# Patient Record
Sex: Female | Born: 1990 | Race: Black or African American | Hispanic: No | Marital: Single | Smoking: Never smoker
Health system: Southern US, Community
[De-identification: ages and names within clinical notes are randomized; demographics above are authoritative.]

## PROBLEM LIST (undated history)

## (undated) ENCOUNTER — Inpatient Hospital Stay (HOSPITAL_COMMUNITY): Payer: Self-pay

## (undated) DIAGNOSIS — Z789 Other specified health status: Secondary | ICD-10-CM

---
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
## 2010-06-30 HISTORY — PX: INDUCED ABORTION: SHX677

## 2010-10-13 ENCOUNTER — Inpatient Hospital Stay (HOSPITAL_COMMUNITY)
Admission: AD | Admit: 2010-10-13 | Discharge: 2010-10-13 | Disposition: A | Discharge status: Home / Self Care | Payer: Medicaid Other | Source: Ambulatory Visit | Attending: Family Medicine | Admitting: Family Medicine

## 2010-10-13 DIAGNOSIS — R109 Unspecified abdominal pain: Secondary | ICD-10-CM

## 2010-10-13 LAB — URINALYSIS, ROUTINE W REFLEX MICROSCOPIC
Bilirubin Urine: NEGATIVE
Nitrite: NEGATIVE
Protein, ur: NEGATIVE mg/dL
Specific Gravity, Urine: 1.025 (ref 1.005–1.030)
Urobilinogen, UA: 0.2 mg/dL (ref 0.0–1.0)

## 2010-10-13 LAB — URINE MICROSCOPIC-ADD ON

## 2010-10-13 LAB — CBC
Platelets: 280 10*3/uL (ref 150–400)
RBC: 4.52 MIL/uL (ref 3.87–5.11)
RDW: 12.7 % (ref 11.5–15.5)
WBC: 8.4 10*3/uL (ref 4.0–10.5)

## 2010-10-13 LAB — POCT PREGNANCY, URINE: Preg Test, Ur: NEGATIVE

## 2010-10-13 LAB — WET PREP, GENITAL
Trich, Wet Prep: NONE SEEN
Yeast Wet Prep HPF POC: NONE SEEN

## 2010-10-14 LAB — GC/CHLAMYDIA PROBE AMP, GENITAL
Chlamydia, DNA Probe: NEGATIVE
GC Probe Amp, Genital: NEGATIVE

## 2011-04-15 ENCOUNTER — Emergency Department (HOSPITAL_COMMUNITY)
Admission: EM | Admit: 2011-04-15 | Discharge: 2011-04-15 | Disposition: A | Discharge status: Home / Self Care | Payer: Self-pay | Attending: Emergency Medicine | Admitting: Emergency Medicine

## 2011-04-15 DIAGNOSIS — R112 Nausea with vomiting, unspecified: Secondary | ICD-10-CM | POA: Insufficient documentation

## 2011-04-15 DIAGNOSIS — R197 Diarrhea, unspecified: Secondary | ICD-10-CM | POA: Insufficient documentation

## 2011-04-15 DIAGNOSIS — B3731 Acute candidiasis of vulva and vagina: Secondary | ICD-10-CM | POA: Insufficient documentation

## 2011-04-15 DIAGNOSIS — B373 Candidiasis of vulva and vagina: Secondary | ICD-10-CM | POA: Insufficient documentation

## 2011-04-15 LAB — POCT PREGNANCY, URINE: Preg Test, Ur: NEGATIVE

## 2011-04-15 LAB — URINALYSIS, ROUTINE W REFLEX MICROSCOPIC
Bilirubin Urine: NEGATIVE
Ketones, ur: NEGATIVE mg/dL
Nitrite: NEGATIVE
Protein, ur: NEGATIVE mg/dL
Urobilinogen, UA: 0.2 mg/dL (ref 0.0–1.0)

## 2011-04-15 LAB — WET PREP, GENITAL
Clue Cells Wet Prep HPF POC: NONE SEEN
Trich, Wet Prep: NONE SEEN
Yeast Wet Prep HPF POC: NONE SEEN

## 2011-04-15 LAB — URINE MICROSCOPIC-ADD ON

## 2011-04-17 LAB — GC/CHLAMYDIA PROBE AMP, GENITAL: GC Probe Amp, Genital: NEGATIVE

## 2013-08-26 ENCOUNTER — Inpatient Hospital Stay (HOSPITAL_COMMUNITY)
Admission: AD | Admit: 2013-08-26 | Discharge: 2013-08-26 | Disposition: A | Discharge status: Home / Self Care | Payer: BC Managed Care – PPO | Source: Ambulatory Visit | Attending: Obstetrics & Gynecology | Admitting: Obstetrics & Gynecology

## 2013-08-26 ENCOUNTER — Encounter (HOSPITAL_COMMUNITY): Payer: Self-pay | Admitting: *Deleted

## 2013-08-26 DIAGNOSIS — I770 Arteriovenous fistula, acquired: Secondary | ICD-10-CM | POA: Insufficient documentation

## 2013-08-26 DIAGNOSIS — N938 Other specified abnormal uterine and vaginal bleeding: Secondary | ICD-10-CM | POA: Insufficient documentation

## 2013-08-26 DIAGNOSIS — N926 Irregular menstruation, unspecified: Secondary | ICD-10-CM | POA: Insufficient documentation

## 2013-08-26 DIAGNOSIS — N939 Abnormal uterine and vaginal bleeding, unspecified: Secondary | ICD-10-CM

## 2013-08-26 DIAGNOSIS — N949 Unspecified condition associated with female genital organs and menstrual cycle: Secondary | ICD-10-CM | POA: Insufficient documentation

## 2013-08-26 HISTORY — DX: Other specified health status: Z78.9

## 2013-08-26 LAB — CBC
HEMATOCRIT: 30.8 % — AB (ref 36.0–46.0)
Hemoglobin: 10.5 g/dL — ABNORMAL LOW (ref 12.0–15.0)
MCH: 29.2 pg (ref 26.0–34.0)
MCHC: 34.1 g/dL (ref 30.0–36.0)
MCV: 85.6 fL (ref 78.0–100.0)
Platelets: 281 10*3/uL (ref 150–400)
RBC: 3.6 MIL/uL — ABNORMAL LOW (ref 3.87–5.11)
RDW: 13.4 % (ref 11.5–15.5)
WBC: 6.1 10*3/uL (ref 4.0–10.5)

## 2013-08-26 LAB — URINALYSIS, ROUTINE W REFLEX MICROSCOPIC
Bilirubin Urine: NEGATIVE
GLUCOSE, UA: NEGATIVE mg/dL
Ketones, ur: 15 mg/dL — AB
LEUKOCYTES UA: NEGATIVE
NITRITE: NEGATIVE
PH: 5.5 (ref 5.0–8.0)
PROTEIN: NEGATIVE mg/dL
Specific Gravity, Urine: 1.015 (ref 1.005–1.030)
Urobilinogen, UA: 0.2 mg/dL (ref 0.0–1.0)

## 2013-08-26 LAB — URINE MICROSCOPIC-ADD ON

## 2013-08-26 LAB — POCT PREGNANCY, URINE: PREG TEST UR: NEGATIVE

## 2013-08-26 NOTE — MAU Note (Signed)
Has been having irregular periods since Nov.  LMP was last wk, stopped for a few days and started again yesterday.

## 2013-08-26 NOTE — MAU Note (Signed)
Went to get an US of the uterus today, for heavy bleeding. (primary care physician sent her here to see GYN, she brought the disc from the US with her)

## 2013-08-26 NOTE — MAU Provider Note (Signed)
History     CSN: [id number]  Arrival date and time: 08/26/13 1541   First Provider Initiated Contact with Patient 08/26/13 1734      Chief Complaint  Patient presents with  . referral for GYN    HPI  [name] is a 23 y.o. female G1P0010 who was sent from [facility] following an [place] that showed an AV fistula. On 1/17: she started her menstrual cycle and it was much heavier than normal.  Her menstrual cycle stopped and then restarted again this past Monday. Currently her bleeding is light. She had an US done at [facility]; report was faxed and shows highly vascular structure filling the endometrium with prominent myometrial vessels. 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 Pt needs GYN referral and MRI outpatient.  Both ovaries show normal vascular flow.  Small cyst versus small paraovarian cyst on the right.    OB History   Grav Para Term Preterm Abortions TAB SAB Ect Mult Living   1 0 0 0 1 0 0 0 0 0       Past Medical History  Diagnosis Date  . Medical history non-contributory     Past Surgical History  Procedure Laterality Date  . Induced abortion  06/2010    Family History  Problem Relation Age of Onset  . Hypertension Mother   . Hypertension Maternal Grandmother     History  Substance Use Topics  . Smoking status: Never Smoker   . Smokeless tobacco: Not on file  . Alcohol Use: No    Allergies: No Known Allergies  Prescriptions prior to admission  Medication Sig Dispense Refill  . norethindrone-ethinyl estradiol 1/35 (ORTHO-NOVUM, NORTREL,CYCLAFEM) tablet Take 1 tablet by mouth daily.       Results for orders placed during the hospital encounter of 08/26/13 (from the past 48 hour(s))  URINALYSIS, ROUTINE W REFLEX MICROSCOPIC     Status: Abnormal   Collection Time    08/26/13  4:45 PM      Result Value Range   Color, Urine YELLOW  YELLOW   APPearance CLEAR  CLEAR   Specific Gravity, Urine 1.015  1.005 - 1.030   pH 5.5  5.0  - 8.0   Glucose, UA NEGATIVE  NEGATIVE mg/dL   Hgb urine dipstick LARGE (*) NEGATIVE   Bilirubin Urine NEGATIVE  NEGATIVE   Ketones, ur 15 (*) NEGATIVE mg/dL   Protein, ur NEGATIVE  NEGATIVE mg/dL   Urobilinogen, UA 0.2  0.0 - 1.0 mg/dL   Nitrite NEGATIVE  NEGATIVE   Leukocytes, UA NEGATIVE  NEGATIVE  URINE MICROSCOPIC-ADD ON     Status: Abnormal   Collection Time    08/26/13  4:45 PM      Result Value Range   Squamous Epithelial / LPF FEW (*) RARE   RBC / HPF 0-2  <3 RBC/hpf  CBC     Status: Abnormal   Collection Time    08/26/13  5:06 PM      Result Value Range   WBC 6.1  4.0 - 10.5 K/uL   RBC 3.60 (*) 3.87 - 5.11 MIL/uL   Hemoglobin 10.5 (*) 12.0 - 15.0 g/dL   HCT 40.9 (*) 81.1 - 91.4 %   MCV 85.6  78.0 - 100.0 fL   MCH 29.2  26.0 - 34.0 pg   MCHC 34.1  30.0 - 36.0 g/dL   RDW 78.2  95.6 - 21.3 %   Platelets 281  150 - 400  K/uL  POCT PREGNANCY, URINE     Status: None   Collection Time    08/26/13  5:10 PM      Result Value Range   Preg Test, Ur NEGATIVE  NEGATIVE   Comment:            THE SENSITIVITY OF THIS     METHODOLOGY IS >24 mIU/mL      Review of Systems  Constitutional: Negative for fever and chills.  Gastrointestinal: Negative for nausea, vomiting, abdominal pain, diarrhea and constipation.  Genitourinary: Negative for dysuria, urgency, frequency, hematuria and flank pain.       No vaginal discharge. + vaginal bleeding; light  No dysuria.    Physical Exam   Blood pressure 115/77, pulse 51, temperature 99.2 F (37.3 C), temperature source Oral, resp. rate 16, height 4\' 11"  (1.499 m), weight 63.504 kg (140 lb), last menstrual period 08/16/2013.  Physical Exam  Constitutional: She is oriented to person, place, and time. She appears well-developed and well-nourished. No distress.  HENT:  Head: Normocephalic.  Eyes: Pupils are equal, round, and reactive to light.  Neck: Neck supple.  Respiratory: Effort normal.  GI: Soft. She exhibits no distension  and no mass. There is no tenderness. There is no rebound and no guarding.  Genitourinary:  Speculum exam: Vagina - Small amount of red, mucus like discharge. No pooling of blood in the vagina  Cervix - No contact bleeding, small active bleeding from OS  Bimanual exam: Cervix closed Uterus non tender, normal size Adnexa non tender, no masses bilaterally Chaperone present for exam.   Musculoskeletal: Normal range of motion.  Neurological: She is alert and oriented to person, place, and time.  Skin: Skin is warm. She is not diaphoretic.  Psychiatric: Her behavior is normal.    MAU Course  Procedures None  MDM Consulted with Dr. Macon LargeAnyanwu; plan of care discussed  Outpatient MRI scheduled  CBC Referral to the GYN clinic   Assessment and Plan   Assessment:  1. A-V fistula   2. Abnormal uterine bleeding       Plan: Bleeding precautions Continue birth control Iron daily as directed on the bottle Return to MAU as needed, if symptoms worsen Clinic referral made MRI referral sent; they will call you to schedule  If you have any problems getting the MRI scheduled please call us back   Iona HansenJennifer Irene Asmaa Tirpak, NP  08/26/2013, 7:01 PM

## 2013-08-26 NOTE — Discharge Instructions (Signed)

## 2013-08-28 NOTE — MAU Provider Note (Signed)
Attestation of Attending Supervision of Advanced Practitioner (PA/CNM/NP): Evaluation and management procedures were performed by the Advanced Practitioner under my supervision and collaboration.  I have reviewed the Advanced Practitioner's note and chart, and I agree with the management and plan.  Modestine Scherzinger, MD, FACOG Attending Obstetrician & Gynecologist Faculty Practice, [facility]  

## 2013-08-29 ENCOUNTER — Encounter: Payer: Self-pay | Admitting: Obstetrics & Gynecology

## 2013-10-09 ENCOUNTER — Encounter: Payer: Self-pay | Admitting: Obstetrics & Gynecology

## 2013-10-09 ENCOUNTER — Ambulatory Visit (INDEPENDENT_AMBULATORY_CARE_PROVIDER_SITE_OTHER): Payer: BC Managed Care – PPO | Admitting: Obstetrics & Gynecology

## 2013-10-09 VITALS — BP 111/65 | HR 63 | Temp 98.0°F | Ht 61.0 in | Wt 141.8 lb

## 2013-10-09 DIAGNOSIS — N921 Excessive and frequent menstruation with irregular cycle: Secondary | ICD-10-CM

## 2013-10-09 DIAGNOSIS — N920 Excessive and frequent menstruation with regular cycle: Secondary | ICD-10-CM

## 2013-10-09 NOTE — Patient Instructions (Signed)

## 2013-10-09 NOTE — Progress Notes (Signed)
Patient ID: Debra Carter, female   DOB: September 09, 1990, 23 y.o.   MRN: [id number]  Chief Complaint  Patient presents with  . Menorrhagia    HPI Debra Carter is a 23 y.o. female.  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 No bleeding, wants BCM, used Nuvaring in the past  HPI  Past Medical History  Diagnosis Date  . Medical history non-contributory     Past Surgical History  Procedure Laterality Date  . Induced abortion  06/2010    Family History  Problem Relation Age of Onset  . Hypertension Mother   . Hypertension Maternal Grandmother     Social History History  Substance Use Topics  . Smoking status: Never Smoker   . Smokeless tobacco: Not on file  . Alcohol Use: No    No Known Allergies  Current Outpatient Prescriptions  Medication Sig Dispense Refill  . norethindrone-ethinyl estradiol 1/35 (ORTHO-NOVUM, NORTREL,CYCLAFEM) tablet Take 1 tablet by mouth daily.       No current facility-administered medications for this visit.    Review of Systems Review of Systems  Constitutional: Negative.   Genitourinary: Positive for menstrual problem. Negative for vaginal bleeding, vaginal discharge and pelvic pain.    Blood pressure 111/65, pulse 63, temperature 98 F (36.7 C), temperature source Oral, height 5\' 1"  (1.549 m), weight 141 lb 12.8 oz (64.32 kg), last menstrual period 08/12/2013.  Physical Exam Physical Exam  Constitutional: She is oriented to person, place, and time. She appears well-developed and well-nourished.  Pulmonary/Chest: Effort normal. No respiratory distress.  Genitourinary: Vagina normal and uterus normal. No vaginal discharge found.  No mass  Neurological: She is alert and oriented to person, place, and time.  Skin: Skin is warm and dry.  Psychiatric: She has a normal mood and affect. Her behavior is normal.    Data Reviewed ED notes  Assessment    Abnormal uterine bleeding,  abnl endometrium on US     Plan    Repeat US Ortho Evra RTC 6 months        Debra Carter 10/09/2013, 4:20 PM

## 2013-10-10 LAB — GC/CHLAMYDIA PROBE AMP
CT Probe RNA: NEGATIVE
GC Probe RNA: NEGATIVE

## 2013-10-13 ENCOUNTER — Telehealth: Payer: Self-pay

## 2013-10-13 NOTE — Telephone Encounter (Signed)
Message copied by Louanna RawAMPBELL, Maribelle Hopple M on Mon Oct 13, 2013 11:55 AM ------      Message from: Adam PhenixARNOLD, JAMES G      Created: Thu Oct 09, 2013  4:23 PM       Schedule US pelvis order in ------

## 2013-10-13 NOTE — Telephone Encounter (Signed)
Pelvic ultrasound scheduled 10/21/13 at 0830. Attempted to call pt. Left message stating we are calling to inform you about an appointment we've scheduled for you, please call clinic at earliest convenience. Per chart review, pt. To have this ultrasound and f/u in clinic in 6 months.

## 2013-10-14 NOTE — Telephone Encounter (Signed)
Called pt. And informed her of ultrasound date and time. Advised her to have a full bladder for this procedure that may take up to an hour. Pt. States she was also told to follow up in 6 months. Advised pt. That this is correct and that she should call in July to make f/u appointment for September but informed her that if anything is abnormal with ultrasound and Dr. Debroah LoopArnold feels she should be seen earlier then we will call her with results/appointment. Pt. Verbalized understanding and gratitude and had no further questions or concerns.

## 2013-10-21 ENCOUNTER — Ambulatory Visit (HOSPITAL_COMMUNITY): Admission: RE | Admit: 2013-10-21 | Payer: BC Managed Care – PPO | Source: Ambulatory Visit

## 2013-10-21 ENCOUNTER — Ambulatory Visit (HOSPITAL_COMMUNITY): Payer: BC Managed Care – PPO

## 2013-11-03 ENCOUNTER — Ambulatory Visit (HOSPITAL_COMMUNITY): Admission: RE | Admit: 2013-11-03 | Payer: BC Managed Care – PPO | Source: Ambulatory Visit

## 2013-11-13 ENCOUNTER — Telehealth: Payer: Self-pay

## 2013-11-13 NOTE — Telephone Encounter (Signed)
Pt. Called stating she had STD ch/chlamydia testing done last month and would like to know the results. Called pt. No answer. Left message stating we are returning your call, all of your lab results last month were normal and negative, please call clinic if you have any other questions or concerns.

## 2014-06-01 ENCOUNTER — Encounter: Payer: Self-pay | Admitting: Obstetrics & Gynecology

## 2014-07-31 NOTE — L&D Delivery Note (Signed)
Patient was C/C/+2 and pushed for <10 minutes with epidural.   NSVD female infant, Apgars 0/0, weight pending.   The patient had no laceration. Fundus was firm. EBL was expected amount. Placenta was delivered intact. Vagina was clear.    Debra Carter

## 2014-09-29 ENCOUNTER — Encounter (HOSPITAL_COMMUNITY): Payer: Self-pay | Admitting: *Deleted

## 2014-09-29 ENCOUNTER — Inpatient Hospital Stay (HOSPITAL_COMMUNITY)
Admission: AD | Admit: 2014-09-29 | Discharge: 2014-09-29 | Disposition: A | Discharge status: Home / Self Care | Payer: Medicaid Other | Source: Ambulatory Visit | Attending: Obstetrics and Gynecology | Admitting: Obstetrics and Gynecology

## 2014-09-29 DIAGNOSIS — O9989 Other specified diseases and conditions complicating pregnancy, childbirth and the puerperium: Secondary | ICD-10-CM | POA: Diagnosis not present

## 2014-09-29 DIAGNOSIS — Z3A21 21 weeks gestation of pregnancy: Secondary | ICD-10-CM | POA: Insufficient documentation

## 2014-09-29 DIAGNOSIS — R109 Unspecified abdominal pain: Secondary | ICD-10-CM | POA: Diagnosis present

## 2014-09-29 DIAGNOSIS — N949 Unspecified condition associated with female genital organs and menstrual cycle: Secondary | ICD-10-CM

## 2014-09-29 LAB — URINALYSIS, ROUTINE W REFLEX MICROSCOPIC
BILIRUBIN URINE: NEGATIVE
Glucose, UA: NEGATIVE mg/dL
Hgb urine dipstick: NEGATIVE
Ketones, ur: NEGATIVE mg/dL
NITRITE: NEGATIVE
PROTEIN: NEGATIVE mg/dL
UROBILINOGEN UA: 0.2 mg/dL (ref 0.0–1.0)
pH: 7 (ref 5.0–8.0)

## 2014-09-29 LAB — COMPREHENSIVE METABOLIC PANEL
ALK PHOS: 63 U/L (ref 39–117)
ALT: 17 U/L (ref 0–35)
ANION GAP: 8 (ref 5–15)
AST: 17 U/L (ref 0–37)
Albumin: 3.1 g/dL — ABNORMAL LOW (ref 3.5–5.2)
BUN: 6 mg/dL (ref 6–23)
CALCIUM: 8.4 mg/dL (ref 8.4–10.5)
CO2: 22 mmol/L (ref 19–32)
CREATININE: 0.52 mg/dL (ref 0.50–1.10)
Chloride: 106 mmol/L (ref 96–112)
GFR calc non Af Amer: >90 mL/min (ref >90)
GLUCOSE: 88 mg/dL (ref 70–99)
POTASSIUM: 3.6 mmol/L (ref 3.5–5.1)
Sodium: 136 mmol/L (ref 135–145)
TOTAL PROTEIN: 6.9 g/dL (ref 6.0–8.3)
Total Bilirubin: 0.6 mg/dL (ref 0.3–1.2)

## 2014-09-29 LAB — CBC WITH DIFFERENTIAL/PLATELET
BASOS ABS: 0 10*3/uL (ref 0.0–0.1)
Basophils Relative: 0 % (ref 0–1)
EOS ABS: 0.1 10*3/uL (ref 0.0–0.7)
EOS PCT: 1 % (ref 0–5)
HCT: 30.3 % — ABNORMAL LOW (ref 36.0–46.0)
HEMOGLOBIN: 10.1 g/dL — AB (ref 12.0–15.0)
LYMPHS ABS: 2 10*3/uL (ref 0.7–4.0)
Lymphocytes Relative: 19 % (ref 12–46)
MCH: 28.5 pg (ref 26.0–34.0)
MCHC: 33.3 g/dL (ref 30.0–36.0)
MCV: 85.6 fL (ref 78.0–100.0)
MONOS PCT: 8 % (ref 3–12)
Monocytes Absolute: 0.8 10*3/uL (ref 0.1–1.0)
NEUTROS PCT: 72 % (ref 43–77)
Neutro Abs: 7.5 10*3/uL (ref 1.7–7.7)
PLATELETS: 258 10*3/uL (ref 150–400)
RBC: 3.54 MIL/uL — AB (ref 3.87–5.11)
RDW: 12.8 % (ref 11.5–15.5)
WBC: 10.4 10*3/uL (ref 4.0–10.5)

## 2014-09-29 LAB — URINE MICROSCOPIC-ADD ON

## 2014-09-29 LAB — WET PREP, GENITAL
TRICH WET PREP: NONE SEEN
YEAST WET PREP: NONE SEEN

## 2014-09-29 NOTE — MAU Provider Note (Signed)
History     CSN: [id number]  Arrival date and time: 09/29/14 8657   First Provider Initiated Contact with Patient 09/29/14 2056      Chief Complaint  Patient presents with  . Abdominal Pain  . Back Pain  . pelvic pressure    HPI  [name] is a 24 y.o. G2P0010 at [redacted]w[redacted]d who presents to MAU today with complaint of abdominal pain since yesterday. She describes this has cramping of the bilateral lower abdomen. She also states that she has noted vaginal pressure x 1 month. She statesa that pain comes and goes, feels like tightening. It is worse with change of positions and better with rest. She denies vaginal bleeding, discharge, LOF, fever, N/V/D or constipation. She reports normal fetal movement. Previous OB history includes surgical TAB.   OB History    Gravida Para Term Preterm AB TAB SAB Ectopic Multiple Living        Past Medical History  Diagnosis Date  . Medical history non-contributory     Past Surgical History  Procedure Laterality Date  . Induced abortion  06/2010    Family History  Problem Relation Age of Onset  . Hypertension Mother   . Hypertension Maternal Grandmother     History  Substance Use Topics  . Smoking status: Never Smoker   . Smokeless tobacco: Not on file  . Alcohol Use: No    Allergies: No Known Allergies  No prescriptions prior to admission    Review of Systems  Constitutional: Negative for fever and malaise/fatigue.  Gastrointestinal: Positive for abdominal pain and constipation. Negative for nausea, vomiting and diarrhea.  Genitourinary: Negative for dysuria, urgency and frequency.       Neg - vaginal bleeding, discharge, LOF   Physical Exam   Blood pressure 111/69, pulse 83, temperature 98.7 F (37.1 C), temperature source Oral, resp. rate 18, last menstrual period 08/12/2013.  Physical Exam  Constitutional: She is oriented to person, place, and time. She appears well-developed and well-nourished. No  distress.  HENT:  Head: Normocephalic.  Cardiovascular: Normal rate.   Respiratory: Effort normal.  GI: Soft. Bowel sounds are normal. She exhibits no distension and no mass. There is tenderness (mild tenderness to palpation of the RLQ and right mid abdomen). There is no rebound and no guarding.  Genitourinary: Uterus is enlarged (appropriate for GA). Uterus is not tender. Cervix exhibits friability. Cervix exhibits no motion tenderness and no discharge. No bleeding in the vagina. Vaginal discharge (small amount of thin, white, mucous discharge) found.  Cervix: closed, thick  Neurological: She is alert and oriented to person, place, and time.  Skin: Skin is warm and dry. No erythema.  Psychiatric: She has a normal mood and affect.   Results for orders placed or performed during the hospital encounter of 09/29/14 (from the past 24 hour(s))  Urinalysis, Routine w reflex microscopic     Status: Abnormal   Collection Time: 09/29/14  7:00 PM  Result Value Ref Range   Color, Urine YELLOW YELLOW   APPearance CLEAR CLEAR   Specific Gravity, Urine <1.005 (L) 1.005 - 1.030   pH 7.0 5.0 - 8.0   Glucose, UA NEGATIVE NEGATIVE mg/dL   Hgb urine dipstick NEGATIVE NEGATIVE   Bilirubin Urine NEGATIVE NEGATIVE   Ketones, ur NEGATIVE NEGATIVE mg/dL   Protein, ur NEGATIVE NEGATIVE mg/dL   Urobilinogen, UA 0.2 0.0 - 1.0 mg/dL   Nitrite NEGATIVE NEGATIVE  Leukocytes, UA MODERATE (A) NEGATIVE  Urine microscopic-add on     Status: None   Collection Time: 09/29/14  7:00 PM  Result Value Ref Range   Squamous Epithelial / LPF RARE RARE   WBC, UA 3-6 <3 WBC/hpf   RBC / HPF 0-2 <3 RBC/hpf   Bacteria, UA RARE RARE  Wet prep, genital     Status: Abnormal   Collection Time: 09/29/14  9:25 PM  Result Value Ref Range   Yeast Wet Prep HPF POC NONE SEEN NONE SEEN   Trich, Wet Prep NONE SEEN NONE SEEN   Clue Cells Wet Prep HPF POC FEW (A) NONE SEEN   WBC, Wet Prep HPF POC FEW (A) NONE SEEN  CBC with  Differential/Platelet     Status: Abnormal   Collection Time: 09/29/14  9:25 PM  Result Value Ref Range   WBC 10.4 4.0 - 10.5 K/uL   RBC 3.54 (L) 3.87 - 5.11 MIL/uL   Hemoglobin 10.1 (L) 12.0 - 15.0 g/dL   HCT 96.030.3 (L) 45.436.0 - 09.846.0 %   MCV 85.6 78.0 - 100.0 fL   MCH 28.5 26.0 - 34.0 pg   MCHC 33.3 30.0 - 36.0 g/dL   RDW 11.912.8 14.711.5 - 82.915.5 %   Platelets 258 150 - 400 K/uL   Neutrophils Relative % 72 43 - 77 %   Lymphocytes Relative 19 12 - 46 %   Monocytes Relative 8 3 - 12 %   Eosinophils Relative 1 0 - 5 %   Basophils Relative 0 0 - 1 %   Neutro Abs 7.5 1.7 - 7.7 K/uL   Lymphs Abs 2.0 0.7 - 4.0 K/uL   Monocytes Absolute 0.8 0.1 - 1.0 K/uL   Eosinophils Absolute 0.1 0.0 - 0.7 K/uL   Basophils Absolute 0.0 0.0 - 0.1 K/uL   Smear Review MORPHOLOGY UNREMARKABLE   Comprehensive metabolic panel     Status: Abnormal   Collection Time: 09/29/14  9:25 PM  Result Value Ref Range   Sodium 136 135 - 145 mmol/L   Potassium 3.6 3.5 - 5.1 mmol/L   Chloride 106 96 - 112 mmol/L   CO2 22 19 - 32 mmol/L   Glucose, Bld 88 70 - 99 mg/dL   BUN 6 6 - 23 mg/dL   Creatinine, Ser 5.620.52 0.50 - 1.10 mg/dL   Calcium 8.4 8.4 - 13.010.5 mg/dL   Total Protein 6.9 6.0 - 8.3 g/dL   Albumin 3.1 (L) 3.5 - 5.2 g/dL   AST 17 0 - 37 U/L   ALT 17 0 - 35 U/L   Alkaline Phosphatase 63 39 - 117 U/L   Total Bilirubin 0.6 0.3 - 1.2 mg/dL   GFR calc non Af Amer >90 >90 mL/min   GFR calc Af Amer >90 >90 mL/min   Anion gap 8 5 - 15    MAU Course  Procedures None  MDM FHR - 143 bpm with doppler UA, wet prep, GC/Chlamydia, CBC and CMP today Urine culture Discussed with Dr. Dareen PianoAnderson, agrees with plan of care.  Assessment and Plan  A: SIUP at 6025w2d Round ligament pain  P: Discharge home Advised Tylenol PRN for pain Discussed abdominal binder, warm bath/shower and moderation of activity Patient encouraged to follow-up with Dr. Dareen PianoAnderson as scheduled for routine prenatal care Patient may return to MAU as needed or  if her condition were to change or worsen   Marny LowensteinJulie N Karlin Heilman, PA-C  09/30/2014, 12:20 AM

## 2014-09-29 NOTE — Discharge Instructions (Signed)
Abdominal Pain During Pregnancy °Belly (abdominal) pain is common during pregnancy. Most of the time, it is not a serious problem. Other times, it can be a sign that something is wrong with the pregnancy. Always tell your doctor if you have belly pain. °HOME CARE °Monitor your belly pain for any changes. The following actions may help you feel better: °· Do not have sex (intercourse) or put anything in your vagina until you feel better. °· Rest until your pain stops. °· Drink clear fluids if you feel sick to your stomach (nauseous). Do not eat solid food until you feel better. °· Only take medicine as told by your doctor. °· Keep all doctor visits as told. °GET HELP RIGHT AWAY IF:  °· You are bleeding, leaking fluid, or pieces of tissue come out of your vagina. °· You have more pain or cramping. °· You keep throwing up (vomiting). °· You have pain when you pee (urinate) or have blood in your pee. °· You have a fever. °· You do not feel your baby moving as much. °· You feel very weak or feel like passing out. °· You have trouble breathing, with or without belly pain. °· You have a very bad headache and belly pain. °· You have fluid leaking from your vagina and belly pain. °· You keep having watery poop (diarrhea). °· Your belly pain does not go away after resting, or the pain gets worse. °MAKE SURE YOU:  °· Understand these instructions. °· Will watch your condition. °· Will get help right away if you are not doing well or get worse. °Document Released: 07/05/2009 Document Revised: 03/19/2013 Document Reviewed: 02/13/2013 °Exit[employer]® Patient Information ©2015 [employer]. This information is not intended to replace advice given to you by your health care provider. Make sure you discuss any questions you have with your health care provider. ° °

## 2014-09-29 NOTE — MAU Note (Signed)
Lower abd cramping since yesterday, also vaginal pain & pressure, back pain.  Denies bleeding or LOF.  Clear discharge, denies odor or itching.

## 2014-09-29 NOTE — MAU Note (Signed)
Pt states that abdominal lower pain began yesterday and is not constant but when it does come it is a 10/10. Pt states that pain did not wake her up last night and she is able to sleep. Pt denies bleeding. Pt states that she is feeling baby move.

## 2014-09-29 NOTE — MAU Note (Signed)
Pt was going to [facility] but [facility] - has first appointment later this month.

## 2014-09-30 LAB — GC/CHLAMYDIA PROBE AMP (~~LOC~~) NOT AT ARMC
CHLAMYDIA, DNA PROBE: NEGATIVE
Neisseria Gonorrhea: NEGATIVE

## 2014-10-01 LAB — CULTURE, OB URINE: Colony Count: >=100000

## 2014-10-05 ENCOUNTER — Telehealth: Payer: Self-pay | Admitting: *Deleted

## 2014-10-05 NOTE — Telephone Encounter (Signed)
Patient is pregnant and interested in scheduling a NOB appointment. Attempted to contact patient and left message for her to call the office.

## 2014-10-06 NOTE — Telephone Encounter (Signed)
Attempted to contact the patient and left message for patient to call the office.  

## 2014-10-19 LAB — OB RESULTS CONSOLE RPR: RPR: NONREACTIVE

## 2014-10-19 LAB — OB RESULTS CONSOLE ABO/RH: RH TYPE: POSITIVE

## 2014-10-19 LAB — OB RESULTS CONSOLE HEPATITIS B SURFACE ANTIGEN: HEP B S AG: NEGATIVE

## 2014-10-19 LAB — OB RESULTS CONSOLE HIV ANTIBODY (ROUTINE TESTING): HIV: NONREACTIVE

## 2014-10-19 LAB — OB RESULTS CONSOLE GC/CHLAMYDIA
CHLAMYDIA, DNA PROBE: NEGATIVE
Gonorrhea: NEGATIVE

## 2014-10-19 LAB — OB RESULTS CONSOLE RUBELLA ANTIBODY, IGM: Rubella: IMMUNE

## 2014-12-07 NOTE — Telephone Encounter (Signed)
Patient has not returned call to office. Will re-file call.

## 2015-01-12 ENCOUNTER — Other Ambulatory Visit: Payer: Self-pay | Admitting: Obstetrics and Gynecology

## 2015-01-12 LAB — OB RESULTS CONSOLE GC/CHLAMYDIA
Chlamydia: NEGATIVE
Gonorrhea: NEGATIVE

## 2015-01-12 LAB — OB RESULTS CONSOLE GBS: STREP GROUP B AG: POSITIVE

## 2015-01-23 ENCOUNTER — Inpatient Hospital Stay (HOSPITAL_COMMUNITY)
Admission: AD | Admit: 2015-01-23 | Discharge: 2015-01-24 | DRG: 775 | Disposition: A | Discharge status: Home / Self Care | Payer: Medicaid Other | Source: Ambulatory Visit | Attending: Obstetrics and Gynecology | Admitting: Obstetrics and Gynecology

## 2015-01-23 ENCOUNTER — Inpatient Hospital Stay (HOSPITAL_COMMUNITY): Payer: Medicaid Other

## 2015-01-23 ENCOUNTER — Encounter (HOSPITAL_COMMUNITY): Payer: Self-pay | Admitting: Advanced Practice Midwife

## 2015-01-23 DIAGNOSIS — O99214 Obesity complicating childbirth: Secondary | ICD-10-CM | POA: Diagnosis present

## 2015-01-23 DIAGNOSIS — O368190 Decreased fetal movements, unspecified trimester, not applicable or unspecified: Secondary | ICD-10-CM | POA: Insufficient documentation

## 2015-01-23 DIAGNOSIS — O364XX0 Maternal care for intrauterine death, not applicable or unspecified: Principal | ICD-10-CM | POA: Diagnosis present

## 2015-01-23 DIAGNOSIS — Z3A37 37 weeks gestation of pregnancy: Secondary | ICD-10-CM | POA: Insufficient documentation

## 2015-01-23 DIAGNOSIS — Z6839 Body mass index (BMI) 39.0-39.9, adult: Secondary | ICD-10-CM

## 2015-01-23 DIAGNOSIS — O99824 Streptococcus B carrier state complicating childbirth: Secondary | ICD-10-CM | POA: Diagnosis present

## 2015-01-23 DIAGNOSIS — E669 Obesity, unspecified: Secondary | ICD-10-CM | POA: Diagnosis present

## 2015-01-23 DIAGNOSIS — IMO0002 Reserved for concepts with insufficient information to code with codable children: Secondary | ICD-10-CM | POA: Diagnosis present

## 2015-01-23 LAB — RAPID URINE DRUG SCREEN, HOSP PERFORMED
Amphetamines: NOT DETECTED
Barbiturates: NOT DETECTED
Benzodiazepines: NOT DETECTED
Cocaine: NOT DETECTED
Opiates: NOT DETECTED
Tetrahydrocannabinol: NOT DETECTED

## 2015-01-23 LAB — URINALYSIS, ROUTINE W REFLEX MICROSCOPIC
Bilirubin Urine: NEGATIVE
Glucose, UA: NEGATIVE mg/dL
HGB URINE DIPSTICK: NEGATIVE
Ketones, ur: NEGATIVE mg/dL
Nitrite: NEGATIVE
PH: 6 (ref 5.0–8.0)
Protein, ur: NEGATIVE mg/dL
Specific Gravity, Urine: 1.01 (ref 1.005–1.030)
Urobilinogen, UA: 0.2 mg/dL (ref 0.0–1.0)

## 2015-01-23 LAB — CBC
HCT: 33.7 % — ABNORMAL LOW (ref 36.0–46.0)
Hemoglobin: 11.3 g/dL — ABNORMAL LOW (ref 12.0–15.0)
MCH: 26.7 pg (ref 26.0–34.0)
MCHC: 33.5 g/dL (ref 30.0–36.0)
MCV: 79.7 fL (ref 78.0–100.0)
Platelets: 259 10*3/uL (ref 150–400)
RBC: 4.23 MIL/uL (ref 3.87–5.11)
RDW: 16.3 % — AB (ref 11.5–15.5)
WBC: 8.6 10*3/uL (ref 4.0–10.5)

## 2015-01-23 LAB — KLEIHAUER-BETKE STAIN
# VIALS RHIG: 1
FETAL CELLS %: 0 %
Quantitation Fetal Hemoglobin: 0 mL

## 2015-01-23 LAB — URINE MICROSCOPIC-ADD ON

## 2015-01-23 LAB — TYPE AND SCREEN
ABO/RH(D): A POS
Antibody Screen: NEGATIVE

## 2015-01-23 LAB — ABO/RH: ABO/RH(D): A POS

## 2015-01-23 MED ORDER — ACETAMINOPHEN 325 MG PO TABS: 650.0000 mg | ORAL_TABLET | ORAL | Status: DC | PRN

## 2015-01-23 MED ORDER — LACTATED RINGERS IV SOLN
INTRAVENOUS | Status: DC
  Administered 2015-01-23 – 2015-01-24 (×2): via INTRAVENOUS

## 2015-01-23 MED ORDER — LIDOCAINE HCL (PF) 1 % IJ SOLN
30.0000 mL | INTRAMUSCULAR | Status: DC | PRN
  Filled 2015-01-23: qty 30

## 2015-01-23 MED ORDER — LACTATED RINGERS IV SOLN: 500.0000 mL | INTRAVENOUS | Status: DC | PRN

## 2015-01-23 MED ORDER — FLEET ENEMA 7-19 GM/118ML RE ENEM: 1.0000 | ENEMA | RECTAL | Status: DC | PRN

## 2015-01-23 MED ORDER — OXYCODONE-ACETAMINOPHEN 5-325 MG PO TABS: 2.0000 | ORAL_TABLET | ORAL | Status: DC | PRN

## 2015-01-23 MED ORDER — OXYTOCIN 40 UNITS IN LACTATED RINGERS INFUSION - SIMPLE MED
62.5000 mL/h | INTRAVENOUS | Status: DC
  Filled 2015-01-23: qty 1000

## 2015-01-23 MED ORDER — PHENYLEPHRINE 40 MCG/ML (10ML) SYRINGE FOR IV PUSH (FOR BLOOD PRESSURE SUPPORT)
80.0000 ug | PREFILLED_SYRINGE | INTRAVENOUS | Status: DC | PRN
  Administered 2015-01-24 (×2): 80 ug via INTRAVENOUS
  Filled 2015-01-23: qty 20
  Filled 2015-01-23: qty 2

## 2015-01-23 MED ORDER — OXYTOCIN BOLUS FROM INFUSION: 500.0000 mL | INTRAVENOUS | Status: DC

## 2015-01-23 MED ORDER — BUTORPHANOL TARTRATE 1 MG/ML IJ SOLN
1.0000 mg | INTRAMUSCULAR | Status: DC | PRN
  Administered 2015-01-23: 1 mg via INTRAVENOUS
  Filled 2015-01-23: qty 1

## 2015-01-23 MED ORDER — DIPHENHYDRAMINE HCL 50 MG/ML IJ SOLN: 12.5000 mg | INTRAMUSCULAR | Status: DC | PRN

## 2015-01-23 MED ORDER — CITRIC ACID-SODIUM CITRATE 334-500 MG/5ML PO SOLN: 30.0000 mL | ORAL | Status: DC | PRN

## 2015-01-23 MED ORDER — ONDANSETRON HCL 4 MG/2ML IJ SOLN: 4.0000 mg | Freq: Four times a day (QID) | INTRAMUSCULAR | Status: DC | PRN

## 2015-01-23 MED ORDER — FENTANYL 2.5 MCG/ML BUPIVACAINE 1/10 % EPIDURAL INFUSION (WH - ANES)
14.0000 mL/h | INTRAMUSCULAR | Status: DC | PRN
  Filled 2015-01-23: qty 125

## 2015-01-23 MED ORDER — OXYCODONE-ACETAMINOPHEN 5-325 MG PO TABS: 1.0000 | ORAL_TABLET | ORAL | Status: DC | PRN

## 2015-01-23 MED ORDER — ZOLPIDEM TARTRATE 5 MG PO TABS
5.0000 mg | ORAL_TABLET | Freq: Every evening | ORAL | Status: DC | PRN
  Administered 2015-01-24: 5 mg via ORAL
  Filled 2015-01-23: qty 1

## 2015-01-23 MED ORDER — EPHEDRINE 5 MG/ML INJ
10.0000 mg | INTRAVENOUS | Status: DC | PRN
  Filled 2015-01-23: qty 2

## 2015-01-23 MED ORDER — MISOPROSTOL 200 MCG PO TABS
50.0000 ug | ORAL_TABLET | ORAL | Status: DC
  Administered 2015-01-23 – 2015-01-24 (×3): 50 ug via VAGINAL
  Filled 2015-01-23 (×3): qty 0.5

## 2015-01-23 NOTE — H&P (Addendum)
24 y.o. [redacted]w[redacted]d  G2P0010 comes in c/o feeling no fetal movement since Wednesday 01/20/15. She reports no LOF or bleeding, no fevers, no abd tenderness.  She states she experience LOF earlier in pregnancy.  Office chart reviewed and this was reported at 32 weeks.  The physician who saw her performed fern testing that was found to be negative. At her next visit she denied any LOF.  Today she reports to me that after the 32 week assessment she did not experience further LOF.  Last office visit was 01/19/15 where she reported good FM.  Past Medical History  Diagnosis Date  . Medical history non-contributory     Past Surgical History  Procedure Laterality Date  . Induced abortion  06/2010    OB History  Gravida Para Term Preterm AB SAB TAB Ectopic Multiple Living  2 0 0 0 1 0 1 0 0 0     # Outcome Date GA Lbr Len/2nd Weight Sex Delivery Anes PTL Lv  2 Current           1 TAB               History   Social History  . Marital Status: Single    Spouse Name: N/A  . Number of Children: N/A  . Years of Education: N/A   Occupational History  . Not on file.   Social History Main Topics  . Smoking status: Never Smoker   . Smokeless tobacco: Never Used  . Alcohol Use: No  . Drug Use: No  . Sexual Activity: Yes    Birth Control/ Protection: None   Other Topics Concern  . Not on file   Social History Narrative   Review of patient's allergies indicates no known allergies.    Prenatal Transfer Tool  Maternal Diabetes: No Genetic Screening: Normal Maternal Ultrasounds/Referrals: Normal Fetal Ultrasounds or other Referrals:  None Maternal Substance Abuse:  No Significant Maternal Medications:  None Significant Maternal Lab Results: Lab values include: Group B Strep positive  Other PNC: Elevated 1hr, normal 3hr gtt, obesity with BMI 39    Filed Vitals:   01/23/15 1515  BP: 115/72  Pulse: 101  Temp: 98.9 F (37.2 C)  Resp: 18     Lungs/Cor:  NAD Abdomen:  soft, gravid Ex:  no  cords, erythema SVE:  2.5/50/B FHTs:  Absent cardiac activity by [place], subjectively low amniotic fluid Toco:  Quiet    A/P   37.1 by office chart fetal demise Counseled patient about [place] finding of fetal demise, discussed w/u to determine cause, this may take time, up to weeks or months depending on decisions about fetal autopsy.  Indications as to cause may be determined at time of delivery. Discussed delivery mode and recommendation for vaginal delivery made.  Pt agrees and would like to proceed.  Bishop score 3, will ripen cervix with misoprostol q4 hrs up to 6 doses in first 24 hrs.  Once cervix ripened can switch to pitocin. Will obtain type and screen, K-B, and UDS initially. May consider microarray analysis for chromosomes- FTS was normal. IV pain medication and epidural authorized Advise will allow patient meal initially and will then move to clear diet for the remainder of the day. Offered patient SW consult or chaplain services, she declines at this time but aware she can request whenever desired.    Philip Aspen

## 2015-01-23 NOTE — MAU Note (Signed)
US at bedside

## 2015-01-23 NOTE — MAU Provider Note (Signed)
  History     CSN: [id number]  Arrival date and time: 01/23/15 1314   First Provider Initiated Contact with Patient 01/23/15 1333      Chief Complaint  Patient presents with  . Decreased Fetal Movement   HPI  24 y.o. G2P0010 at [redacted]w[redacted]d with no fetal movement x 4 days. Denies pain or loss of fluid, uncomplicated prenatal course.   Past Medical History  Diagnosis Date  . Medical history non-contributory     Past Surgical History  Procedure Laterality Date  . Induced abortion  06/2010    Family History  Problem Relation Age of Onset  . Hypertension Mother   . Hypertension Maternal Grandmother     History  Substance Use Topics  . Smoking status: Never Smoker   . Smokeless tobacco: Not on file  . Alcohol Use: No    Allergies: No Known Allergies  Prescriptions prior to admission  Medication Sig Dispense Refill Last Dose  . Prenatal Vit-Fe Fumarate-FA (MULTIVITAMIN-PRENATAL) 27-0.8 MG TABS tablet Take 1 tablet by mouth daily at 12 noon.   Past Week at Unknown time    ROS Physical Exam   Blood pressure 134/82, pulse 109, temperature 98.2 F (36.8 C), temperature source Oral, resp. rate 18, last menstrual period 08/12/2013, SpO2 99 %.  Physical Exam  Vitals reviewed. Constitutional: She is oriented to person, place, and time. She appears well-developed and well-nourished. No distress.  Cardiovascular: Normal rate.   Respiratory: Effort normal.  GI: Soft. There is no rebound.  Neurological: She is alert and oriented to person, place, and time.  Skin: Skin is warm and dry.  Psychiatric: She has a normal mood and affect.    MAU Course  Procedures Difficult to trace FHR, maternal pulse in 100s-120s, unsure if tracing fetal or maternal initially - some variation noted between FHR and maternal pulse-ox and apparent fetal movement palpated on exam, Dr. Claiborne Billings made aware, bedside u/s ordered stat - no FHR noted on u/s at bedside, Dr. Claiborne Billings called to  unit  Assessment and Plan  Dr. Claiborne Billings to MAU to review u/s to discuss plan of care with patient.   Tyrone Balash 01/23/2015, 1:50 PM

## 2015-01-23 NOTE — MAU Note (Signed)
N. Bascom Levels CNM to bedside due to difficulty tracing FHR on monitor

## 2015-01-23 NOTE — MAU Note (Signed)
Pt presents complaining of decreased fetal movement since Wednesday. Denies vaginal bleeding or discharge.

## 2015-01-23 NOTE — MAU Note (Signed)
Urine in lab 

## 2015-01-24 ENCOUNTER — Inpatient Hospital Stay (HOSPITAL_COMMUNITY): Payer: Medicaid Other | Admitting: Anesthesiology

## 2015-01-24 ENCOUNTER — Encounter (HOSPITAL_COMMUNITY): Payer: Self-pay | Admitting: *Deleted

## 2015-01-24 LAB — RPR: RPR Ser Ql: NONREACTIVE

## 2015-01-24 LAB — CBC WITH DIFFERENTIAL/PLATELET
Basophils Absolute: 0 10*3/uL (ref 0.0–0.1)
Basophils Relative: 0 % (ref 0–1)
Eosinophils Absolute: 0 10*3/uL (ref 0.0–0.7)
Eosinophils Relative: 0 % (ref 0–5)
HCT: 34.4 % — ABNORMAL LOW (ref 36.0–46.0)
HEMOGLOBIN: 11.1 g/dL — AB (ref 12.0–15.0)
LYMPHS ABS: 1.7 10*3/uL (ref 0.7–4.0)
Lymphocytes Relative: 14 % (ref 12–46)
MCH: 25.8 pg — AB (ref 26.0–34.0)
MCHC: 32.3 g/dL (ref 30.0–36.0)
MCV: 79.8 fL (ref 78.0–100.0)
MONO ABS: 1 10*3/uL (ref 0.1–1.0)
Monocytes Relative: 9 % (ref 3–12)
NEUTROS ABS: 8.9 10*3/uL — AB (ref 1.7–7.7)
Neutrophils Relative %: 77 % (ref 43–77)
Platelets: 238 10*3/uL (ref 150–400)
RBC: 4.31 MIL/uL (ref 3.87–5.11)
RDW: 16.2 % — AB (ref 11.5–15.5)
WBC: 11.6 10*3/uL — AB (ref 4.0–10.5)

## 2015-01-24 MED ORDER — BENZOCAINE-MENTHOL 20-0.5 % EX AERO: 1.0000 "application " | INHALATION_SPRAY | CUTANEOUS | Status: DC | PRN

## 2015-01-24 MED ORDER — OXYCODONE-ACETAMINOPHEN 5-325 MG PO TABS: 1.0000 | ORAL_TABLET | ORAL | Status: DC | PRN

## 2015-01-24 MED ORDER — LANOLIN HYDROUS EX OINT: TOPICAL_OINTMENT | CUTANEOUS | Status: DC | PRN

## 2015-01-24 MED ORDER — IBUPROFEN 600 MG PO TABS
600.0000 mg | ORAL_TABLET | Freq: Four times a day (QID) | ORAL | Status: DC
  Administered 2015-01-24: 600 mg via ORAL
  Filled 2015-01-24: qty 1

## 2015-01-24 MED ORDER — SIMETHICONE 80 MG PO CHEW: 80.0000 mg | CHEWABLE_TABLET | ORAL | Status: DC | PRN

## 2015-01-24 MED ORDER — LIDOCAINE HCL (PF) 1 % IJ SOLN
INTRAMUSCULAR | Status: DC | PRN
  Administered 2015-01-24: 11 mL

## 2015-01-24 MED ORDER — ZOLPIDEM TARTRATE 5 MG PO TABS: 5.0000 mg | ORAL_TABLET | Freq: Every evening | ORAL | Status: DC | PRN

## 2015-01-24 MED ORDER — ONDANSETRON HCL 4 MG PO TABS: 4.0000 mg | ORAL_TABLET | ORAL | Status: DC | PRN

## 2015-01-24 MED ORDER — WITCH HAZEL-GLYCERIN EX PADS: 1.0000 "application " | MEDICATED_PAD | CUTANEOUS | Status: DC | PRN

## 2015-01-24 MED ORDER — SENNOSIDES-DOCUSATE SODIUM 8.6-50 MG PO TABS: 2.0000 | ORAL_TABLET | ORAL | Status: DC

## 2015-01-24 MED ORDER — DIBUCAINE 1 % RE OINT: 1.0000 "application " | TOPICAL_OINTMENT | RECTAL | Status: DC | PRN

## 2015-01-24 MED ORDER — FENTANYL 2.5 MCG/ML BUPIVACAINE 1/10 % EPIDURAL INFUSION (WH - ANES): 14.0000 mL/h | INTRAMUSCULAR | Status: DC | PRN

## 2015-01-24 MED ORDER — DIPHENHYDRAMINE HCL 25 MG PO CAPS: 25.0000 mg | ORAL_CAPSULE | Freq: Four times a day (QID) | ORAL | Status: DC | PRN

## 2015-01-24 MED ORDER — PRENATAL MULTIVITAMIN CH
1.0000 | ORAL_TABLET | Freq: Every day | ORAL | Status: DC
  Filled 2015-01-24: qty 1

## 2015-01-24 MED ORDER — TETANUS-DIPHTH-ACELL PERTUSSIS 5-2.5-18.5 LF-MCG/0.5 IM SUSP: 0.5000 mL | Freq: Once | INTRAMUSCULAR | Status: DC

## 2015-01-24 MED ORDER — FENTANYL 2.5 MCG/ML BUPIVACAINE 1/10 % EPIDURAL INFUSION (WH - ANES)
INTRAMUSCULAR | Status: DC | PRN
  Administered 2015-01-24: 14 mL/h via EPIDURAL

## 2015-01-24 MED ORDER — OXYCODONE-ACETAMINOPHEN 5-325 MG PO TABS
2.0000 | ORAL_TABLET | ORAL | Status: DC | PRN
  Administered 2015-01-24: 2 via ORAL
  Filled 2015-01-24: qty 2

## 2015-01-24 MED ORDER — ACETAMINOPHEN 325 MG PO TABS: 650.0000 mg | ORAL_TABLET | ORAL | Status: DC | PRN

## 2015-01-24 MED ORDER — ONDANSETRON HCL 4 MG/2ML IJ SOLN: 4.0000 mg | INTRAMUSCULAR | Status: DC | PRN

## 2015-01-24 NOTE — Progress Notes (Signed)
Acknowledged MD order for support due to IUFD.     Both parents were present, but FOB stepped out shortly after CSW arrival.  Acknowledged their loss.    Patient declined visit with the Chaplin.  Mother report having extensive support.  Encouraged continued support for each other.  Provided mother with support group information and she was receptive.  Informed them of social work Surveyor, mining.

## 2015-01-24 NOTE — Progress Notes (Signed)

## 2015-01-24 NOTE — Discharge Summary (Signed)
Obstetric Discharge Summary Reason for Admission: induction of labor, fetal demise 37weeks Prenatal Procedures: ultrasound Intrapartum Procedures: spontaneous vaginal delivery Postpartum Procedures: none Complications-Operative and Postpartum: none HEMOGLOBIN  Date Value Ref Range Status  01/23/2015 11.3* 12.0 - 15.0 g/dL Final   HCT  Date Value Ref Range Status  01/23/2015 33.7* 36.0 - 46.0 % Final    Physical Exam:  General: alert and cooperative Lochia: appropriate Uterine Fundus: firm DVT Evaluation: No evidence of DVT seen on physical exam.  Discharge Diagnoses: Term Pregnancy-delivered  Discharge Information: Date: 01/24/2015 Activity: pelvic rest Diet: routine Medications: PNV and Ibuprofen Condition: stable Instructions: refer to practice specific booklet Discharge to: home Follow-up Information    Follow up with Canyon Willow, DO In 2 weeks.   Specialty:  Obstetrics and Gynecology   Contact information:   [address] [place] [address] [phone]       Newborn Data: Fetal demise - female  Birth Weight: 5 lb 14 oz (2665 g) APGAR: 0, 0  To hospital morgue.  Philip Aspen 01/24/2015, 10:39 AM

## 2015-01-24 NOTE — Progress Notes (Signed)
Patient has a decreased appetite, is drinking, ambulating, voiding.  Pain control is good.  Appropriate lochia.  States emotionally she is doing fine, is not tearful at this time.  Filed Vitals:   01/24/15 0416 01/24/15 0431 01/24/15 0630 01/24/15 0752  BP: 132/71 126/75 124/60 122/71  Pulse: 77 90 95 90  Temp:  98.2 F (36.8 C) 99.5 F (37.5 C) 99.3 F (37.4 C)  TempSrc:  Oral Oral Oral  Resp: 16 16 17 18   Height:      Weight:      SpO2:   100% 100%    Fundus firm No CT  Lab Results  Component Value Date   WBC 8.6 01/23/2015   HGB 11.3* 01/23/2015   HCT 33.7* 01/23/2015   MCV 79.7 01/23/2015   PLT 259 01/23/2015    --/--/A POS, A POS (06/25 1525)  A/P Post partum day 0 from 37 week fetal demise Discussed SW consultation to come by to discuss resources for her and evaluate, recommend support groups Will check CBC Pt would like to go home, but may change her mind. Advised pt to call me any time for any reason.   Routine care.  Expect d/c later today.    Philip Aspen

## 2015-01-24 NOTE — Plan of Care (Signed)
Problem: Phase I Progression Outcomes Goal: Pain controlled with appropriate interventions Outcome: Completed/Met Date Met:  01/24/15 Has not needed any pain medicaion Goal: Voiding adequately Outcome: Completed/Met Date Met:  01/24/15 Voided large amount of clear yellow urine after arriving to floor. Goal: OOB as tolerated unless otherwise ordered Outcome: Completed/Met Date Met:  01/24/15 Tolerated first time up well. Goal: VS, stable, temp < 100.4 degrees F Outcome: Completed/Met Date Met:  01/24/15 Low grade temp 99.5 Goal: Initial discharge plan identified Outcome: Completed/Met Date Met:  01/24/15 VSS Vaginal bleeding WNL Pain controlled Comfort care done Understands self and f/u care Goal: Other Phase I Outcomes/Goals Outcome: Completed/Met Date Met:  01/24/15 Voided qs amt of clear yellow urine.

## 2015-01-24 NOTE — Progress Notes (Signed)
Pt not interested in seeing chaplin at this time

## 2015-01-24 NOTE — Anesthesia Procedure Notes (Signed)
Epidural Patient location during procedure: OB Start time: 01/24/2015 1:00 AM End time: 01/24/2015 1:18 AM  Staffing Anesthesiologist: Sebastian Ache  Preanesthetic Checklist Completed: patient identified, site marked, surgical consent, pre-op evaluation, timeout performed, IV checked, risks and benefits discussed and monitors and equipment checked  Epidural Patient position: sitting Prep: site prepped and draped and DuraPrep Patient monitoring: heart rate, continuous pulse ox and blood pressure Approach: midline Location: L2-L3 Injection technique: LOR air  Needle:  Needle type: Tuohy  Needle gauge: 17 G Needle length: 9 cm and 9 Needle insertion depth: 6.5 cm Catheter type: closed end flexible Catheter size: 19 Gauge Catheter at skin depth: 14 cm Test dose: negative  Assessment Events: blood not aspirated, injection not painful, no injection resistance, negative IV test and no paresthesia  Additional Notes   Patient tolerated the insertion well without complications.Reason for block:procedure for pain

## 2015-01-24 NOTE — Progress Notes (Signed)
Chaplin on floor - advised that pt does not to see her at this time - chaplin stated to call if needed

## 2015-01-24 NOTE — Anesthesia Preprocedure Evaluation (Signed)
Anesthesia Evaluation  Patient identified by MRN, date of birth, ID band Patient awake and Patient confused    Reviewed: Allergy & Precautions, H&P , NPO status , Patient's Chart, lab work & pertinent test results  Airway Mallampati: II       Dental   Pulmonary  breath sounds clear to auscultation  Pulmonary exam normal       Cardiovascular Exercise Tolerance: Good Normal cardiovascular examRhythm:regular Rate:Normal     Neuro/Psych    GI/Hepatic   Endo/Other    Renal/GU      Musculoskeletal   Abdominal   Peds  Hematology   Anesthesia Other Findings   Reproductive/Obstetrics (+) Pregnancy TERM IUFD                             Anesthesia Physical Anesthesia Plan  ASA: II  Anesthesia Plan: Epidural   Post-op Pain Management:    Induction:   Airway Management Planned:   Additional Equipment:   Intra-op Plan:   Post-operative Plan:   Informed Consent: I have reviewed the patients History and Physical, chart, labs and discussed the procedure including the risks, benefits and alternatives for the proposed anesthesia with the patient or authorized representative who has indicated his/her understanding and acceptance.     Plan Discussed with:   Anesthesia Plan Comments:         Anesthesia Quick Evaluation

## 2015-01-24 NOTE — Anesthesia Postprocedure Evaluation (Signed)
  Anesthesia Post-op Note  Patient: Debra Carter  Procedure(s) Performed: * No procedures listed *  Patient Location: PACU and Mother/Baby  Anesthesia Type:Epidural  Level of Consciousness: awake, alert  and oriented  Airway and Oxygen Therapy: Patient Spontanous Breathing  Post-op Pain: none  Post-op Assessment: Post-op Vital signs reviewed, Patient's Cardiovascular Status Stable, No headache, No backache, No residual numbness and No residual motor weakness  Post-op Vital Signs: Reviewed and stable  Complications: No apparent anesthesia complications

## 2015-02-10 ENCOUNTER — Emergency Department (HOSPITAL_COMMUNITY): Payer: Medicaid Other

## 2015-02-10 ENCOUNTER — Encounter (HOSPITAL_COMMUNITY): Payer: Self-pay | Admitting: Emergency Medicine

## 2015-02-10 ENCOUNTER — Emergency Department (HOSPITAL_COMMUNITY)
Admission: EM | Admit: 2015-02-10 | Discharge: 2015-02-10 | Disposition: A | Discharge status: Home / Self Care | Payer: Medicaid Other | Attending: Emergency Medicine | Admitting: Emergency Medicine

## 2015-02-10 DIAGNOSIS — K088 Other specified disorders of teeth and supporting structures: Secondary | ICD-10-CM | POA: Insufficient documentation

## 2015-02-10 DIAGNOSIS — R0602 Shortness of breath: Secondary | ICD-10-CM | POA: Insufficient documentation

## 2015-02-10 DIAGNOSIS — K0889 Other specified disorders of teeth and supporting structures: Secondary | ICD-10-CM

## 2015-02-10 DIAGNOSIS — J90 Pleural effusion, not elsewhere classified: Secondary | ICD-10-CM

## 2015-02-10 DIAGNOSIS — R079 Chest pain, unspecified: Secondary | ICD-10-CM | POA: Insufficient documentation

## 2015-02-10 DIAGNOSIS — Z79899 Other long term (current) drug therapy: Secondary | ICD-10-CM | POA: Insufficient documentation

## 2015-02-10 LAB — BASIC METABOLIC PANEL
ANION GAP: 8 (ref 5–15)
BUN: 8 mg/dL (ref 6–20)
CO2: 23 mmol/L (ref 22–32)
Calcium: 8.8 mg/dL — ABNORMAL LOW (ref 8.9–10.3)
Chloride: 105 mmol/L (ref 101–111)
Creatinine, Ser: 0.94 mg/dL (ref 0.44–1.00)
GFR calc Af Amer: >60 mL/min (ref >60)
GFR calc non Af Amer: >60 mL/min (ref >60)
Glucose, Bld: 123 mg/dL — ABNORMAL HIGH (ref 65–99)
Potassium: 3.7 mmol/L (ref 3.5–5.1)
Sodium: 136 mmol/L (ref 135–145)

## 2015-02-10 LAB — D-DIMER, QUANTITATIVE (NOT AT ARMC): D DIMER QUANT: 1.4 ug{FEU}/mL — AB (ref 0.00–0.48)

## 2015-02-10 LAB — CBC WITH DIFFERENTIAL/PLATELET
BASOS PCT: 1 % (ref 0–1)
Basophils Absolute: 0 10*3/uL (ref 0.0–0.1)
EOS ABS: 0.1 10*3/uL (ref 0.0–0.7)
Eosinophils Relative: 1 % (ref 0–5)
HEMATOCRIT: 41.8 % (ref 36.0–46.0)
HEMOGLOBIN: 13.4 g/dL (ref 12.0–15.0)
LYMPHS PCT: 20 % (ref 12–46)
Lymphs Abs: 1.7 10*3/uL (ref 0.7–4.0)
MCH: 25.3 pg — AB (ref 26.0–34.0)
MCHC: 32.1 g/dL (ref 30.0–36.0)
MCV: 78.9 fL (ref 78.0–100.0)
MONO ABS: 0.5 10*3/uL (ref 0.1–1.0)
MONOS PCT: 5 % (ref 3–12)
NEUTROS PCT: 73 % (ref 43–77)
Neutro Abs: 6.3 10*3/uL (ref 1.7–7.7)
PLATELETS: 288 10*3/uL (ref 150–400)
RBC: 5.3 MIL/uL — ABNORMAL HIGH (ref 3.87–5.11)
RDW: 15.1 % (ref 11.5–15.5)
WBC: 8.6 10*3/uL (ref 4.0–10.5)

## 2015-02-10 LAB — CHROMOSOME STD, POC(TISSUE)-NCBH

## 2015-02-10 LAB — I-STAT TROPONIN, ED: Troponin i, poc: 0 ng/mL (ref 0.00–0.08)

## 2015-02-10 LAB — BRAIN NATRIURETIC PEPTIDE: B Natriuretic Peptide: 25.2 pg/mL (ref 0.0–100.0)

## 2015-02-10 MED ORDER — IOHEXOL 350 MG/ML SOLN
100.0000 mL | Freq: Once | INTRAVENOUS | Status: AC | PRN
  Administered 2015-02-10: 100 mL via INTRAVENOUS

## 2015-02-10 MED ORDER — PENICILLIN V POTASSIUM 500 MG PO TABS: 1000.0000 mg | ORAL_TABLET | Freq: Two times a day (BID) | ORAL | Status: DC

## 2015-02-10 MED ORDER — SODIUM CHLORIDE 0.9 % IV BOLUS (SEPSIS)
1000.0000 mL | Freq: Once | INTRAVENOUS | Status: AC
  Administered 2015-02-10: 1000 mL via INTRAVENOUS

## 2015-02-10 NOTE — ED Provider Notes (Signed)
CSN: [id number]     Arrival date & time 02/10/15  1246 History   First MD Initiated Contact with Patient 02/10/15 1259     Chief Complaint  Patient presents with  . Dental Pain  . Chest Pain     (Consider location/radiation/quality/duration/timing/severity/associated sxs/prior Treatment) HPI  24 year old female presents with a chief complaint of left mandibular dental pain. She has had a sealant over that left molar and states she feels like she's having increased pain and swelling near her jaw. Has not noticed any drainage. No fevers. When she was in fast track she was asked about chest pain shortness of breath and has had intermittent symptoms over the past couple weeks. A couple weeks ago she delivered a stillborn baby and since then has had intermittent shortness of breath, sometimes when walking. Coughing or fevers. When she gets short of breath she occasionally gets mid chest pain as well. Currently she is asymptomatic. Denies any lung problems or other medical problems. No leg swelling, leg pain, or history of PE/DVT  Past Medical History  Diagnosis Date  . Medical history non-contributory    Past Surgical History  Procedure Laterality Date  . Induced abortion  06/2010   Family History  Problem Relation Age of Onset  . Hypertension Mother   . Hypertension Maternal Grandmother    History  Substance Use Topics  . Smoking status: Never Smoker   . Smokeless tobacco: Never Used  . Alcohol Use: No   OB History    Gravida Para Term Preterm AB TAB SAB Ectopic Multiple Living   2 1 1  0 1 1 0 0 0 0     Review of Systems  Constitutional: Negative for fever.  HENT: Positive for dental problem.   Respiratory: Positive for shortness of breath.   Cardiovascular: Positive for chest pain. Negative for leg swelling.  Gastrointestinal: Negative for vomiting.  All other systems reviewed and are negative.     Allergies  Review of patient's allergies indicates no known  allergies.  Home Medications   Prior to Admission medications   Medication Sig Start Date End Date Taking? Authorizing Provider  ferrous sulfate 325 (65 FE) MG tablet Take 1 tablet by mouth daily. 11/18/14  Yes Historical Provider, MD  Prenatal Vit-Fe Fumarate-FA (MULTIVITAMIN-PRENATAL) 27-0.8 MG TABS tablet Take 1 tablet by mouth daily at 12 noon.   Yes Historical Provider, MD  zolpidem (AMBIEN) 5 MG tablet Take 5 mg by mouth at bedtime as needed for sleep.  01/24/15  Yes Historical Provider, MD   BP 120/81 mmHg  Pulse 106  Temp(Src) 98.9 F (37.2 C) (Oral)  Resp 18  SpO2 100% Physical Exam  Constitutional: She is oriented to person, place, and time. She appears well-developed and well-nourished.  HENT:  Head: Normocephalic and atraumatic.  Right Ear: External ear normal.  Left Ear: External ear normal.  Nose: Nose normal.  Mouth/Throat:    No swelling noted in her oropharynx. Tongue and base of tongue are normal.  Eyes: Right eye exhibits no discharge. Left eye exhibits no discharge.  Cardiovascular: Normal rate, regular rhythm and normal heart sounds.   Pulmonary/Chest: Effort normal and breath sounds normal. She has no wheezes. She has no rales.  Abdominal: Soft. She exhibits no distension. There is no tenderness.  Neurological: She is alert and oriented to person, place, and time.  Skin: Skin is warm and dry.  Nursing note and vitals reviewed.   ED Course  Procedures (including critical care time) Labs Review  Labs Reviewed  BASIC METABOLIC PANEL - Abnormal; Notable for the following:    Glucose, Bld 123 (*)    Calcium 8.8 (*)    All other components within normal limits  CBC WITH DIFFERENTIAL/PLATELET - Abnormal; Notable for the following:    RBC 5.30 (*)    MCH 25.3 (*)    All other components within normal limits  D-DIMER, QUANTITATIVE (NOT AT [facility]) - Abnormal; Notable for the following:    D-Dimer, Quant 1.40 (*)    All other components within normal limits   BRAIN NATRIURETIC PEPTIDE  I-STAT TROPOININ, ED    Imaging Review Dg Chest 2 View  02/10/2015   CLINICAL DATA:  Shortness of breath, chest pain  EXAM: CHEST  2 VIEW  COMPARISON:  None.  FINDINGS: The heart size and mediastinal contours are within normal limits. Both lungs are clear. The visualized skeletal structures are unremarkable.  IMPRESSION: No active cardiopulmonary disease.   Electronically Signed   By: Natasha Mead M.D.   On: 02/10/2015 13:49   Ct Angio Chest Pe W/cm &/or Wo Cm  02/10/2015   CLINICAL DATA:  Shortness of breath and chest pain for 2 weeks, recent stillborn delivery  EXAM: CT ANGIOGRAPHY CHEST WITH CONTRAST  TECHNIQUE: Multidetector CT imaging of the chest was performed using the standard protocol during bolus administration of intravenous contrast. Multiplanar CT image reconstructions and MIPs were obtained to evaluate the vascular anatomy.  CONTRAST:  OMNIPAQUE IOHEXOL 350 MG/ML SOLN  COMPARISON:  Chest x-ray obtained earlier in the same day  FINDINGS: The small left-sided pleural effusion is noted. The lungs are well aerated bilaterally without focal infiltrate or parenchymal nodule.  The thoracic inlet is within normal limits. The thoracic aorta in its branches are unremarkable. Contrast bolus is somewhat limited although no pulmonary embolism is identified. No significant hilar or mediastinal adenopathy is noted. No findings to suggest right heart strain are seen.  The visualized upper abdomen demonstrates a few hypodensities within the liver likely representing cysts but too small for adequate characterization. No acute bony abnormality is noted. Mild engorgement of the breasts is noted related to the recent pregnant state.  Review of the MIP images confirms the above findings.  IMPRESSION: No evidence of pulmonary embolism.  Minimal left-sided pleural effusion.   Electronically Signed   By: Alcide Clever M.D.   On: 02/10/2015 15:31     EKG Interpretation   Date/Time:   Wednesday February 10 2015 13:32:50 EDT Ventricular Rate:  92 PR Interval:  160 QRS Duration: 78 QT Interval:  343 QTC Calculation: 424 R Axis:   78 Text Interpretation:  Sinus rhythm Consider left atrial enlargement no  acute ST/T changes No old tracing to compare Confirmed by Tonyia Marschall  MD,  Daveigh Batty ([id number]) on 02/10/2015 3:46:50 PM      MDM   Final diagnoses:  Pain, dental  Pleural effusion    Patient currently has 2 issues. Her first is dental pain. On my exam there is no obvious competition such as abscess or an obvious infection. However cannot see her tooth well because of his sealant. Due to this, will cover with antibiotics and refer dentistry. Intermittent dyspnea since delivery of stillborn baby. No symptoms of this today. CTA shows no PE or PNA, but does have a small pleural effusion. She is not hypoxic or having any infectious symptoms. Will refer to a PCP for follow up.    Pricilla Loveless, MD 02/10/15 1721

## 2015-02-10 NOTE — ED Notes (Signed)
Pt is 3 wks post partum.  Pt delivered a stillborn baby at 37 wks.  States that she has had CP and SOB since her delivery.

## 2015-02-10 NOTE — Discharge Instructions (Signed)
°Emergency Department Resource Guide °1) Find a Doctor and Pay Out of Pocket °Although you won't have to find out who is covered by your insurance plan, it is a good idea to ask around and get recommendations. You will then need to call the office and see if the doctor you have chosen will accept you as a new patient and what types of options they offer for patients who are self-pay. Some doctors offer discounts or will set up payment plans for their patients who do not have insurance, but you will need to ask so you aren't surprised when you get to your appointment. ° °2) Contact Your Local Health Department °Not all health departments have doctors that can see patients for sick visits, but many do, so it is worth a call to see if yours does. If you don't know where your local health department is, you can check in your phone book. The CDC also has a tool to help you locate your state's health department, and many state websites also have listings of all of their local health departments. ° °3) Find a Walk-in Clinic °If your illness is not likely to be very severe or complicated, you may want to try a walk in clinic. These are popping up all over the country in pharmacies, drugstores, and shopping centers. They're usually staffed by nurse practitioners or physician assistants that have been trained to treat common illnesses and complaints. They're usually fairly quick and inexpensive. However, if you have serious medical issues or chronic medical problems, these are probably not your best option. ° °No Primary Care Doctor: °- Call [facility] at  832-8000 - they can help you locate a primary care doctor that  accepts your insurance, provides certain services, etc. °- Physician Referral Service- 1-800-533-3463 ° °Chronic Pain Problems: °Organization         Address  Phone   Notes  °[facility]  (336) 297-2271 Patients need to be referred by their primary care doctor.  ° °Medication  Assistance: °Organization         Address  Phone   Notes  °[place] [facility] [address] °[place], [place] [address] (336) 641-8030 --Must be a resident of [place] °-- Must have NO insurance coverage whatsoever (no Medicaid/ Medicare, etc.) °-- The pt. MUST have a primary care doctor that directs their care regularly and follows them in the community °  °[facility]  (866) 331-1348   °[employer]  (888) 892-1162   ° °Agencies that provide inexpensive medical care: °Organization         Address  Phone   Notes  °[facility]  (336) 832-8035   °[facility]    (336) 832-7272   °[facility] [address] °[place], [place] [address] (336) 832-4777   °[facility] [address], °[place] (336) 271-4999   °[facility]    (336) 373-0678   °[facility]    (336) 272-1050   °[facility] ° [address], [place] Phone:  (336) 832-4444, Fax:  (336) 832-4440 Hours of Operation:  9 am - 6 pm, M-F.  Also accepts Medicaid/Medicare and self-pay.  °[facility] ° [address], [place] Phone: (336) 832-3150, Fax: (336) 832-3151. Hours of Operation:  8:30 am - 5:30 pm, M-F.  Also accepts Medicaid and self-pay.  °[facility] [place] [address], [place] Phone: (336) 878-6027   °[facility] [address], [place], [place] (336)723-1848, Ext. 123 Mondays & Thursdays: 7-9 AM.  First 15 patients are seen on a first come, first serve basis. °  ° °Medicaid-accepting [place] Providers: ° °Organization         Address  Phone   Notes  °[facility] [address], [place] (336) 641-2100 Also accepts self-pay patients.  °[facility] [address], [place] ° (336) 856-9996   °[facility] [address], [place]  (336) 288-8857   °[facility] [address] [place] [address], [place] (336) 299-7000   °Veita Bland [address], [place]  ° (336) 373-1557 Only accepts [place] Access Medicaid patients after they have their name applied to their card.  ° °Self-Pay (no insurance) in [place]: ° °Organization         Address  Phone   Notes  °Sickle Cell Patients, [facility] [address], [place] (336) 832-1970   °[facility] [address], [place] (336) 832-4400   °[facility] ° [address], [place] (336) 992-4800   °[facility]/Dr. Osei-Bonsu ° [address] [place] [address], [place] or [address], [place] (336) 841-8500 Phone number for both [place] and [place] locations is the same.  °[facility] [address], [place] (336) 299-0000   °[facility] [address] [place] [address], [place] or [address] (336) 852-7530 °(336) 878-2260   °[facility] [address], [place] (336) 350-1642, phone; (336) 294-5005, fax Sees patients 1st and 3rd Saturday of every month.  Must not qualify for public or private insurance (i.e. Medicaid, Medicare, [facility] Health Choice, Veterans' Benefits) • Household income should be no more than 200% of the poverty level •The clinic cannot treat you if you are pregnant or think you are pregnant • Sexually transmitted diseases are not treated at the clinic.  ° ° °Dental Care: °Organization         Address  Phone  Notes  °[place] [facility] [address],  (336) 641-6152 Accepts children up to age 21 who are enrolled in Medicaid or [place] Health Choice; pregnant women with a Medicaid card; and children who have applied for Medicaid or [place] Health Choice, but were declined, whose parents can pay a reduced fee at time of service.  °[place]  [facility] [place]  [address], [place] (336) 641-7733 Accepts children up to age 21 who are enrolled in Medicaid or [place] Health Choice; pregnant women with a Medicaid card; and children who have applied for Medicaid or [place] Health Choice, but were declined, whose parents can pay a reduced fee at time of service.  °[facility] ° [address],  (336) 641-4533 Patients are seen by appointment only. Walk-ins are not accepted. [facility] will see patients 18 years of age and older. °Monday - Tuesday (8am-5pm) °Most Wednesdays (8:30-5pm) °$30 per visit, cash only  °[facility] ° [address], [place] (336) 641-4533 Patients are seen by appointment only. Walk-ins are not accepted. [facility] will see patients 18 years of age and older. °One   Wednesday Evening (Monthly: Volunteer Based).  $30 per visit, cash only  °[facility]  (919) 537-3737 for adults; Children under age 4, call Graduate Pediatric Dentistry at ([phone]) [phone]. Children aged 4-14, please call (919) [phone] to request a pediatric application. ° Dental services are provided in all areas of dental care including fillings, crowns and bridges, complete and partial dentures, implants, gum treatment, root canals, and extractions. Preventive care is also provided. Treatment is provided to both adults and children. °Patients are selected via a lottery and there is often a waiting list. °  °[facility] [address], °[place] ° (336) 763-8833 [email or web address] °  °[facility] [address], [place], [place] (336)723-1848, Ext. 123 Second and Fourth Thursday of each month, opens at 6:30 AM; Clinic ends at 9 AM.  Patients are seen on a first-come first-served basis, and a limited number are seen during each clinic.  ° °[facility] ° [address], [place], [place] (336) 723-7904    Eligibility Requirements °You must have lived in [place], [place], or [place] counties for at least the last three months. °  You cannot be eligible for state or federal sponsored healthcare insurance, including Veterans Administration, Medicaid, or Medicare. °  You generally cannot be eligible for healthcare insurance through your employer.  °  How to apply: °Eligibility screenings are held every Tuesday and Wednesday afternoon from 1:00 pm until 4:00 pm. You do not need an appointment for the interview!  °[facility] [address], [place], [place] 336-631-2330   °[facility] [facility]  336-342-8273   °[facility]  336-703-3100   °[facility]  336-570-6415   ° °Behavioral Health Resources in the Community: °Intensive Outpatient Programs °Organization         Address  Phone  Notes  °[facility] [address], [place], [place] 336-878-6098   °[facility] [address], [place], [place] 336-832-9800   °[facility] [address], [place], [place] ° 336-882-2125   °[facility] [address],  °[place] 1-800-853-5163 or 336-641-4981   °Substance Abuse Resources °Organization         Address  Phone  Notes  °[facility]  336-882-2125   °[facility]  336-784-9470   °[facility]  336-285-9073   °[facility]  336-845-3988   °Residential & Outpatient Substance Abuse Program  1-800-659-3381   °Psychological Services °Organization         Address  Phone  Notes  °[facility]  336- 832-9600   °[facility]  336- 378-7881   °[facility] [address], [place] 1-800-853-5163 or 336-641-4981   ° °Mobile Crisis Teams °Organization         Address  Phone  Notes  °[facility], Mobile Crisis Care Unit  1-877-626-1772   °Assertive °[facility] ° [address]  , [place] 336-834-9664   °Sharon DeEsch [address] ° [place] 336-554-5454   ° °Self-Help/Support Groups °Organization         Address  Phone             Notes  °[facility]  - variety of support groups  336- 373-1402 Call for more information  °Narcotics Anonymous (NA), [facility] [address], °[place]  2 meetings at this location  ° °  Residential Treatment Programs °Organization         Address  Phone  Notes  °[facility] [address],    °[place]  1-866-801-8205   °[facility] ° [address], [place], [place] 704-293-8524   °[facility] [address], [place] 336-845-3988 Admissions: 8am-3pm M-F  °[facility] [address]    °[place], [place] 336-841-1104   °[facility] [address], [place], [place] 336-379-7146   °[facility] [address]  °[place], [place] 336-285-9073   °[facility] Intensive Outpatient [address], [place], [place] 336-852-3033   °[facility] [address]  °[place], [place] 1-877-615-2722 or 336-784-9470   °[facility] [address] [place], [place] 336-227-7417 Accepts Medicaid  °[facility] [address]  ° [place] 1-800-659-3381 Substance Abuse/Addiction Treatment  ° °[place] Behavioral Health Resources °Organization         Address  Phone  Notes  °[facility]  (888) 581-9988   °Julie Brannon, PhD [address] [place], [place]   (336) 349-5553 or (336) 951-0000   °[facility]   [address] °[place], [place] (336) 349-4454   °[facility] [address], [place], [place] (336) 342-8316 Insurance/Medicaid/sponsorship through [facility]  °[facility] [address]                                    [place], [place] (336) 342-8316 Therapy/tele-psych/case    °[facility] [address]  ° [place], [place] (336) 349-2233    °Dr. Arfeen  (336) 349-4544   °[facility] [place]  [employer] [place] [facility] 1) [address], [place] °2) [address], [place] °3)  [address], [place] (336) 349-3220 °(336) 342-7768 ° °(336) 342-8140   °[place] [facility] (336) 342-1394 or (336) 342-3537 (After Hours)    ° ° °

## 2015-02-10 NOTE — ED Notes (Signed)
Pt c/o lt sided dental pain since yesterday.

## 2015-02-10 NOTE — ED Provider Notes (Signed)
MSE was initiated and I personally evaluated the patient and placed orders (if any) at  1:07 PM on February 10, 2015.  Debra Carter is a 24 y.o. female who presents to the Emergency Department complaining of constant, mild, left lower dental pain and swelling with onset 1 week ago and recent worsening pain. Pt notes an injury to a molar in the affected area. She rates the pain as a 10/10 and describes it as "aching and throbbing" and further notes it doesn't radiate. Pt notes taking OTC pain medications with no relief. Pt notes going to see a dental provider but was told they couldn't pull the affected tooth due to her no longer having medicaid. She denies any draining from her gums, fever or chills.  She notes constant, mild, CP and SOB with onset since she delivered a still born 3 weeks ago. Was told by her OBGYN to come to the ER for eval, but didn't come.  BP 120/81 mmHg  Pulse 106  Temp(Src) 98.9 F (37.2 C) (Oral)  Resp 18  SpO2 100% Given the hx of recent delivery and CP/SOB since then, tachycardia at 106, will proceed with SOB work up including D-dimer.   The patient appears stable so that the remainder of the MSE may be completed by another provider.  [address] N[address] Argyle DriveMercedes Delhiamprubi-Soms, PA-C 02/10/15 1309  Pricilla LovelessScott Goldston, MD 02/10/15 [phone]

## 2016-03-17 ENCOUNTER — Encounter (HOSPITAL_COMMUNITY): Payer: Self-pay

## 2016-03-17 ENCOUNTER — Inpatient Hospital Stay (HOSPITAL_COMMUNITY)
Admission: AD | Admit: 2016-03-17 | Discharge: 2016-03-17 | Disposition: A | Discharge status: Home / Self Care | Payer: Medicaid Other | Source: Ambulatory Visit | Attending: Obstetrics and Gynecology | Admitting: Obstetrics and Gynecology

## 2016-03-17 DIAGNOSIS — N949 Unspecified condition associated with female genital organs and menstrual cycle: Secondary | ICD-10-CM

## 2016-03-17 DIAGNOSIS — Z79899 Other long term (current) drug therapy: Secondary | ICD-10-CM | POA: Insufficient documentation

## 2016-03-17 DIAGNOSIS — R102 Pelvic and perineal pain: Secondary | ICD-10-CM | POA: Insufficient documentation

## 2016-03-17 DIAGNOSIS — R1010 Upper abdominal pain, unspecified: Secondary | ICD-10-CM

## 2016-03-17 DIAGNOSIS — O26891 Other specified pregnancy related conditions, first trimester: Secondary | ICD-10-CM | POA: Insufficient documentation

## 2016-03-17 DIAGNOSIS — Z3A13 13 weeks gestation of pregnancy: Secondary | ICD-10-CM | POA: Insufficient documentation

## 2016-03-17 LAB — URINALYSIS, ROUTINE W REFLEX MICROSCOPIC
Bilirubin Urine: NEGATIVE
Glucose, UA: NEGATIVE mg/dL
HGB URINE DIPSTICK: NEGATIVE
Ketones, ur: NEGATIVE mg/dL
Nitrite: NEGATIVE
PROTEIN: NEGATIVE mg/dL
pH: 6 (ref 5.0–8.0)

## 2016-03-17 LAB — URINE MICROSCOPIC-ADD ON

## 2016-03-17 LAB — POCT PREGNANCY, URINE: Preg Test, Ur: POSITIVE — AB

## 2016-03-17 MED ORDER — PANTOPRAZOLE SODIUM 20 MG PO TBEC: 20.0000 mg | DELAYED_RELEASE_TABLET | Freq: Every day | ORAL | 1 refills | Status: DC

## 2016-03-17 MED ORDER — DOXYLAMINE-PYRIDOXINE 10-10 MG PO TBEC: 2.0000 | DELAYED_RELEASE_TABLET | Freq: Every evening | ORAL | 0 refills | Status: DC | PRN

## 2016-03-17 NOTE — MAU Note (Addendum)
Pt states that she is about [redacted] weeks pregnant and c/o abdominal pain x1 week. States that it feels like it "knots up." Denies vaginal bleeding, but has some white discharge that itches. LMP: 12/12/2015

## 2016-03-17 NOTE — Discharge Instructions (Signed)
Round Ligament Pain The round ligament is a cord of muscle and tissue that helps to support the uterus. It can become a source of pain during pregnancy if it becomes stretched or twisted as the baby grows. The pain usually begins in the second trimester of pregnancy, and it can come and go until the baby is delivered. It is not a serious problem, and it does not cause harm to the baby. Round ligament pain is usually a short, sharp, and pinching pain, but it can also be a dull, lingering, and aching pain. The pain is felt in the lower side of the abdomen or in the groin. It usually starts deep in the groin and moves up to the outside of the hip area. Pain can occur with:  A sudden change in position.  Rolling over in bed.  Coughing or sneezing.  Physical activity. HOME CARE INSTRUCTIONS Watch your condition for any changes. Take these steps to help with your pain:  When the pain starts, relax. Then try:  Sitting down.  Flexing your knees up to your abdomen.  Lying on your side with one pillow under your abdomen and another pillow between your legs.  Sitting in a warm bath for 15-20 minutes or until the pain goes away.  Take over-the-counter and prescription medicines only as told by your health care provider.  Move slowly when you sit and stand.  Avoid long walks if they cause pain.  Stop or lessen your physical activities if they cause pain. SEEK MEDICAL CARE IF:  Your pain does not go away with treatment.  You feel pain in your back that you did not have before.  Your medicine is not helping. SEEK IMMEDIATE MEDICAL CARE IF:  You develop a fever or chills.  You develop uterine contractions.  You develop vaginal bleeding.  You develop nausea or vomiting.  You develop diarrhea.  You have pain when you urinate.   This information is not intended to replace advice given to you by your health care provider. Make sure you discuss any questions you have with your health  care provider.   Document Released: 04/25/2008 Document Revised: 10/09/2011 Document Reviewed: 09/23/2014 Elsevier Interactive Patient Education 2016 Elsevier Inc. Gastroesophageal Reflux Disease, Adult Normally, food travels down the esophagus and stays in the stomach to be digested. However, when a person has gastroesophageal reflux disease (GERD), food and stomach acid move back up into the esophagus. When this happens, the esophagus becomes sore and inflamed. Over time, GERD can create small holes (ulcers) in the lining of the esophagus.  CAUSES This condition is caused by a problem with the muscle between the esophagus and the stomach (lower esophageal sphincter, or LES). Normally, the LES muscle closes after food passes through the esophagus to the stomach. When the LES is weakened or abnormal, it does not close properly, and that allows food and stomach acid to go back up into the esophagus. The LES can be weakened by certain dietary substances, medicines, and medical conditions, including:  Tobacco use.  Pregnancy.  Having a hiatal hernia.  Heavy alcohol use.  Certain foods and beverages, such as coffee, chocolate, onions, and peppermint. RISK FACTORS This condition is more likely to develop in:  People who have an increased body weight.  People who have connective tissue disorders.  People who use NSAID medicines. SYMPTOMS Symptoms of this condition include:  Heartburn.  Difficult or painful swallowing.  The feeling of having a lump in the throat.  Abitter taste  in the mouth.  Bad breath.  Having a large amount of saliva.  Having an upset or bloated stomach.  Belching.  Chest pain.  Shortness of breath or wheezing.  Ongoing (chronic) cough or a night-time cough.  Wearing away of tooth enamel.  Weight loss. Different conditions can cause chest pain. Make sure to see your health care provider if you experience chest pain. DIAGNOSIS Your health care  provider will take a medical history and perform a physical exam. To determine if you have mild or severe GERD, your health care provider may also monitor how you respond to treatment. You may also have other tests, including:  An endoscopy toexamine your stomach and esophagus with a small camera.  A test thatmeasures the acidity level in your esophagus.  A test thatmeasures how much pressure is on your esophagus.  A barium swallow or modified barium swallow to show the shape, size, and functioning of your esophagus. TREATMENT The goal of treatment is to help relieve your symptoms and to prevent complications. Treatment for this condition may vary depending on how severe your symptoms are. Your health care provider may recommend:  Changes to your diet.  Medicine.  Surgery. HOME CARE INSTRUCTIONS Diet  Follow a diet as recommended by your health care provider. This may involve avoiding foods and drinks such as:  Coffee and tea (with or without caffeine).  Drinks that containalcohol.  Energy drinks and sports drinks.  Carbonated drinks or sodas.  Chocolate and cocoa.  Peppermint and mint flavorings.  Garlic and onions.  Horseradish.  Spicy and acidic foods, including peppers, chili powder, curry powder, vinegar, hot sauces, and barbecue sauce.  Citrus fruit juices and citrus fruits, such as oranges, lemons, and limes.  Tomato-based foods, such as red sauce, chili, salsa, and pizza with red sauce.  Fried and fatty foods, such as donuts, french fries, potato chips, and high-fat dressings.  High-fat meats, such as hot dogs and fatty cuts of red and white meats, such as rib eye steak, sausage, ham, and bacon.  High-fat dairy items, such as whole milk, butter, and cream cheese.  Eat small, frequent meals instead of large meals.  Avoid drinking large amounts of liquid with your meals.  Avoid eating meals during the 2-3 hours before bedtime.  Avoid lying down right  after you eat.  Do not exercise right after you eat. General Instructions  Pay attention to any changes in your symptoms.  Take over-the-counter and prescription medicines only as told by your health care provider. Do not take aspirin, ibuprofen, or other NSAIDs unless your health care provider told you to do so.  Do not use any tobacco products, including cigarettes, chewing tobacco, and e-cigarettes. If you need help quitting, ask your health care provider.  Wear loose-fitting clothing. Do not wear anything tight around your waist that causes pressure on your abdomen.  Raise (elevate) the head of your bed 6 inches (15cm).  Try to reduce your stress, such as with yoga or meditation. If you need help reducing stress, ask your health care provider.  If you are overweight, reduce your weight to an amount that is healthy for you. Ask your health care provider for guidance about a safe weight loss goal.  Keep all follow-up visits as told by your health care provider. This is important. SEEK MEDICAL CARE IF:  You have new symptoms.  You have unexplained weight loss.  You have difficulty swallowing, or it hurts to swallow.  You have wheezing or  a persistent cough.  Your symptoms do not improve with treatment.  You have a hoarse voice. SEEK IMMEDIATE MEDICAL CARE IF:  You have pain in your arms, neck, jaw, teeth, or back.  You feel sweaty, dizzy, or light-headed.  You have chest pain or shortness of breath.  You vomit and your vomit looks like blood or coffee grounds.  You faint.  Your stool is bloody or black.  You cannot swallow, drink, or eat.   This information is not intended to replace advice given to you by your health care provider. Make sure you discuss any questions you have with your health care provider.   Document Released: 04/26/2005 Document Revised: 04/07/2015 Document Reviewed: 11/11/2014 Elsevier Interactive Patient Education [employer]2016 Elsevier Inc.

## 2016-03-17 NOTE — MAU Provider Note (Signed)
Chief Complaint: Abdominal Pain   First Provider Initiated Contact with Patient 03/17/16 2156        SUBJECTIVE HPI: Debra Carter is a 25 y.o. G3P1010 at 2021w5d by LMP who presents to maternity admissions reporting mild upper abdominal "knotting up" pain. States it is not really pain.  Has intermittent sharp pains on sides of lower abdomen.  Also has vaginal itching/burning, with discharge. She denies vaginal bleeding,  urinary symptoms, h/a, dizziness, n/v, or fever/chills.    Plans to start care with Dr Billy Coastaavon but has not yet.  Abdominal Pain  This is a new problem. The current episode started in the past 7 days. The onset quality is gradual. The problem occurs intermittently. The problem has been waxing and waning. The pain is located in the epigastric region, LLQ and RLQ. The pain is mild. The quality of the pain is cramping. The abdominal pain does not radiate. Pertinent negatives include no anorexia, constipation, diarrhea, dysuria, frequency, headaches, nausea or vomiting. Nothing aggravates the pain. The pain is relieved by nothing. She has tried nothing for the symptoms.   Pt states that she is about [redacted] weeks pregnant and c/o abdominal pain x1 week. States that it feels like it "knots up." Denies vaginal bleeding, but has some white discharge that itches. LMP: 12/12/2015  Past Medical History:  Diagnosis Date  . Medical history non-contributory    Past Surgical History:  Procedure Laterality Date  . INDUCED ABORTION  06/2010   Social History   Social History  . Marital status: Single    Spouse name: N/A  . Number of children: N/A  . Years of education: N/A   Occupational History  . Not on file.   Social History Main Topics  . Smoking status: Never Smoker  . Smokeless tobacco: Never Used  . Alcohol use No  . Drug use: No  . Sexual activity: Yes    Birth control/ protection: None   Other Topics Concern  . Not on file   Social History Narrative  . No narrative on  file   No current facility-administered medications on file prior to encounter.    Current Outpatient Prescriptions on File Prior to Encounter  Medication Sig Dispense Refill  . penicillin v potassium (VEETID) 500 MG tablet Take 2 tablets (1,000 mg total) by mouth 2 (two) times daily. X 7 days (Patient not taking: Reported on 03/17/2016) 28 tablet 0   No Known Allergies  I have reviewed patient's Past Medical Hx, Surgical Hx, Family Hx, Social Hx, medications and allergies.   ROS:  Review of Systems  Gastrointestinal: Positive for abdominal pain (epigastric and round ligament). Negative for anorexia, constipation, diarrhea, nausea and vomiting.  Genitourinary: Negative for dysuria and frequency.  Neurological: Negative for headaches.   Other systems negative  Physical Exam  Patient Vitals for the past 24 hrs:  BP Temp Temp src Pulse Resp SpO2 Height Weight  03/17/16 2026 119/76 99 F (37.2 C) Oral 93 18 98 % 5' (1.524 m) 80.7 kg (178 lb)   Physical Exam  Constitutional: Well-developed, well-nourished female in no acute distress.  Cardiovascular: normal rate Respiratory: normal effort GI: Abd soft, non-tender.  Slight tenderness over round ligaments.  Pos BS x 4 MS: Extremities nontender, no edema, normal ROM Neurologic: Alert and oriented x 4.  GU: Neg CVAT.  PELVIC EXAM: Cervix pink, visually closed, without lesion, scant white creamy discharge, vaginal walls and external genitalia normal Bimanual exam: Cervix 0/long/high, firm, anterior, neg CMT, uterus  nontender, nonenlarged, adnexa without tenderness, enlargement, or mass  FHT 150 by doppler  LAB RESULTS Results for orders placed or performed during the hospital encounter of 03/17/16 (from the past 24 hour(s))  Urinalysis, Routine w reflex microscopic ([facility])     Status: Abnormal   Collection Time: 03/17/16  8:20 PM  Result Value Ref Range   Color, Urine YELLOW YELLOW   APPearance CLEAR CLEAR   Specific  Gravity, Urine <1.005 (L) 1.005 - 1.030   pH 6.0 5.0 - 8.0   Glucose, UA NEGATIVE NEGATIVE mg/dL   Hgb urine dipstick NEGATIVE NEGATIVE   Bilirubin Urine NEGATIVE NEGATIVE   Ketones, ur NEGATIVE NEGATIVE mg/dL   Protein, ur NEGATIVE NEGATIVE mg/dL   Nitrite NEGATIVE NEGATIVE   Leukocytes, UA SMALL (A) NEGATIVE  Urine microscopic-add on     Status: Abnormal   Collection Time: 03/17/16  8:20 PM  Result Value Ref Range   Squamous Epithelial / LPF 0-5 (A) NONE SEEN   WBC, UA 0-5 0 - 5 WBC/hpf   RBC / HPF 0-5 0 - 5 RBC/hpf   Bacteria, UA RARE (A) NONE SEEN  Pregnancy, urine POC     Status: Abnormal   Collection Time: 03/17/16  8:39 PM  Result Value Ref Range   Preg Test, Ur POSITIVE (A) NEGATIVE       IMAGING No results found.  MAU Management/MDM: UA reviewed: normal  Discussed epigastric pain may be acid reflux Discussed round ligament pain and comfort measures  This bleeding/pain can represent a normal pregnancy with bleeding, spontaneous abortion or even an ectopic which can be life-threatening.  The process as listed above helps to determine which of these is present.    ASSESSMENT SIUP at 7380w6d Probable GERD Round ligament pain  PLAN Discharge home Diet as tolerated Rx Protonix for probable GERD Followup with prenatal visits as soon as possible.  Pt stable at time of discharge. Encouraged to return here or to other Urgent Care/ED if she develops worsening of symptoms, increase in pain, fever, or other concerning symptoms.    Wynelle BourgeoisMarie Courtney Fenlon CNM, MSN Certified Nurse-Midwife 03/17/2016  9:56 PM

## 2016-04-13 LAB — OB RESULTS CONSOLE GC/CHLAMYDIA
CHLAMYDIA, DNA PROBE: NEGATIVE
GC PROBE AMP, GENITAL: NEGATIVE

## 2016-04-13 LAB — OB RESULTS CONSOLE GBS: GBS: POSITIVE

## 2016-04-13 LAB — OB RESULTS CONSOLE HEPATITIS B SURFACE ANTIGEN: Hepatitis B Surface Ag: NEGATIVE

## 2016-04-13 LAB — OB RESULTS CONSOLE HIV ANTIBODY (ROUTINE TESTING): HIV: NONREACTIVE

## 2016-04-13 LAB — OB RESULTS CONSOLE ANTIBODY SCREEN: ANTIBODY SCREEN: NEGATIVE

## 2016-04-13 LAB — OB RESULTS CONSOLE ABO/RH: RH Type: POSITIVE

## 2016-04-13 LAB — OB RESULTS CONSOLE RPR: RPR: NONREACTIVE

## 2016-04-13 LAB — OB RESULTS CONSOLE RUBELLA ANTIBODY, IGM: Rubella: IMMUNE

## 2016-05-01 IMAGING — CT CT ANGIO CHEST
2 of 6 series · 18 of 36 positions shown · IV contrast (OMNIPAQUE 350)
Comparison: Chest x-ray obtained earlier in the same day

CLINICAL DATA: Shortness of breath and chest pain for 2 weeks,
recent stillborn delivery

EXAM:
CT ANGIOGRAPHY CHEST WITH CONTRAST
TECHNIQUE: Multidetector CT imaging of the chest was performed using the
standard protocol during bolus administration of intravenous
contrast. Multiplanar CT image reconstructions and MIPs were
obtained to evaluate the vascular anatomy.
CONTRAST:  100mL OMNIPAQUE IOHEXOL 350 MG/ML SOLN

[Series 6: thins for pacs · axial · 0.64mm/px · z∈[+1452,+1662]mm · 17 of 234 slices shown]
[im 12/234  lung]
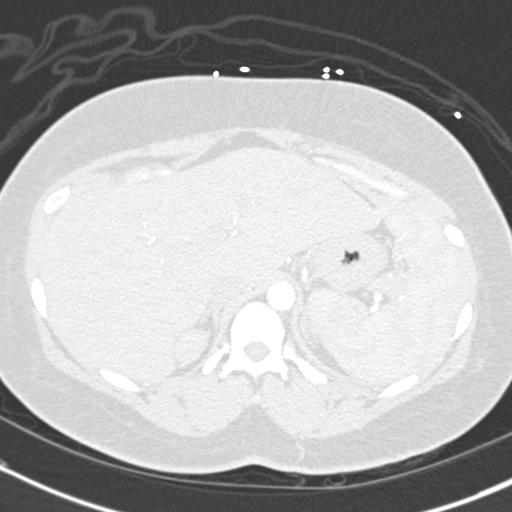
[im 24/234  mediastinal]
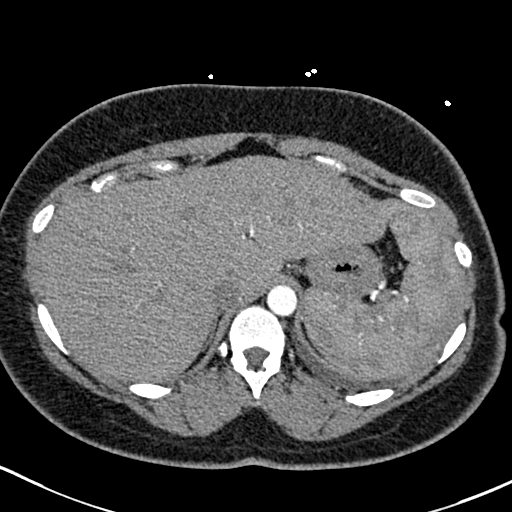
[im 35/234  lung]
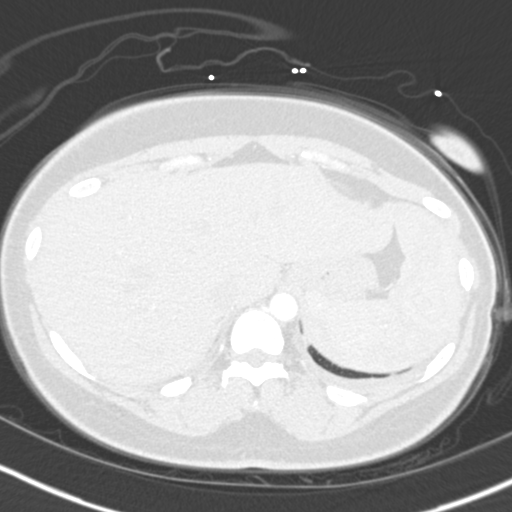
[im 47/234  mediastinal]
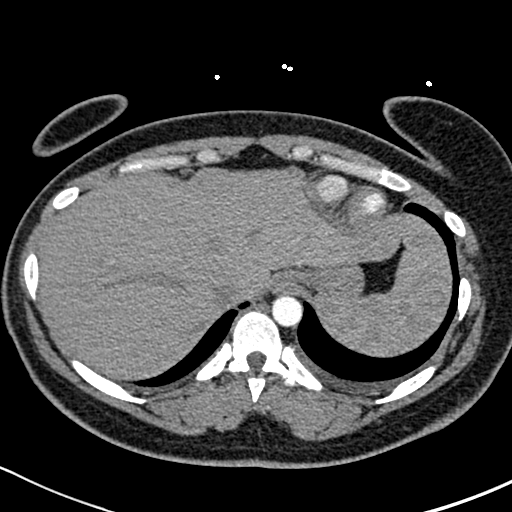
[im 70/234  lung]
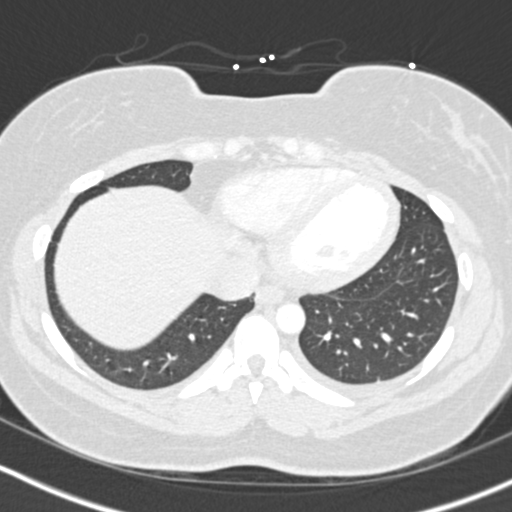
[im 82/234  mediastinal]
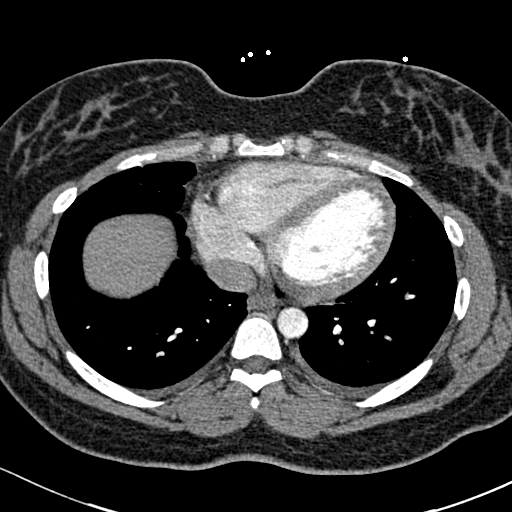
[im 94/234  lung]
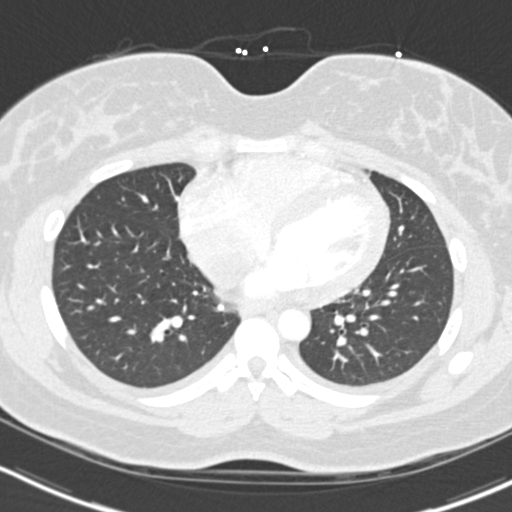
[im 105/234  mediastinal]
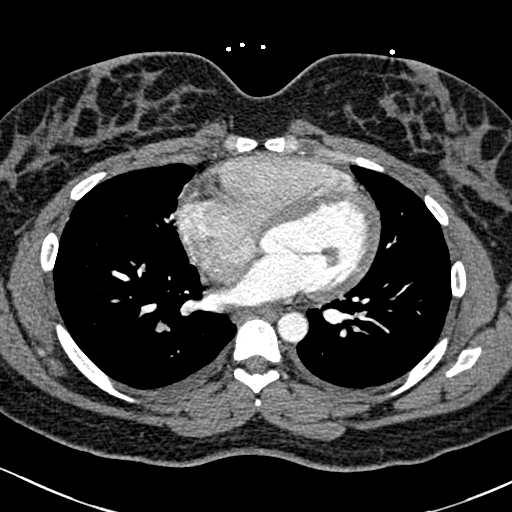
[im 117/234  lung]
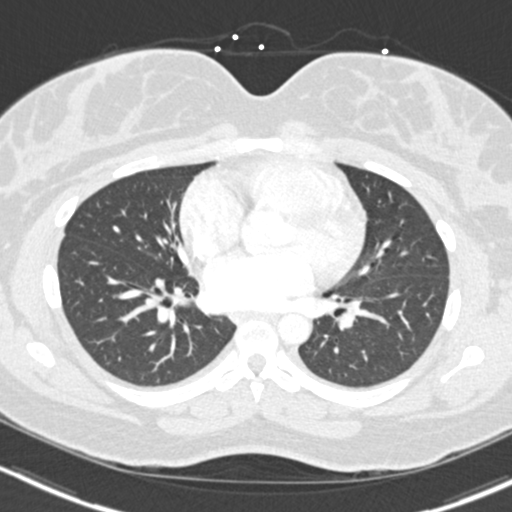
[im 129/234  mediastinal]
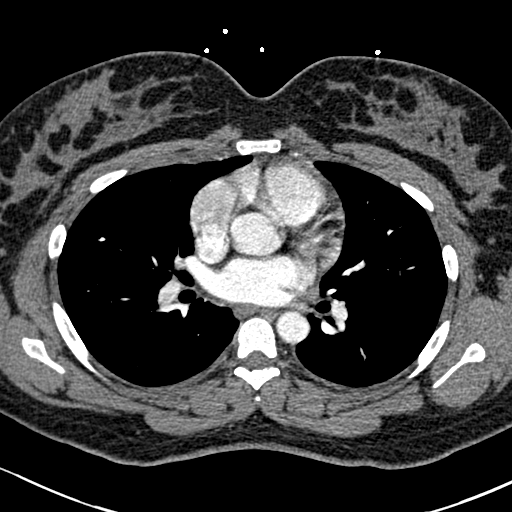
[im 140/234  lung]
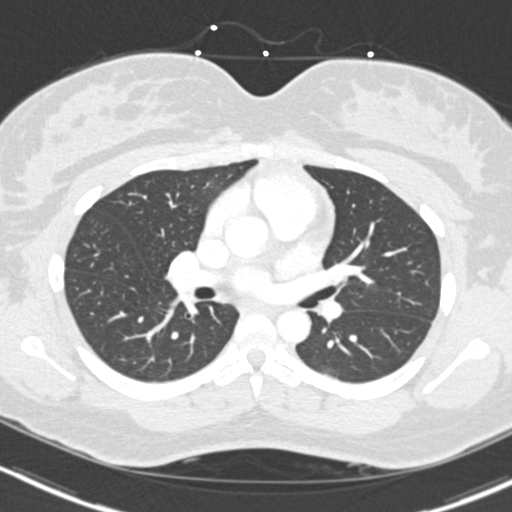
[im 152/234  mediastinal]
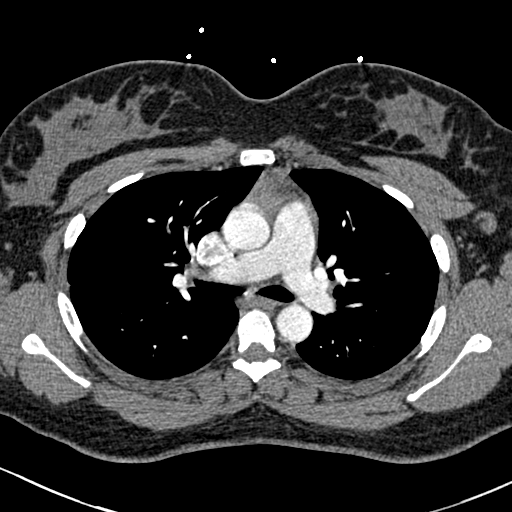
[im 164/234  lung]
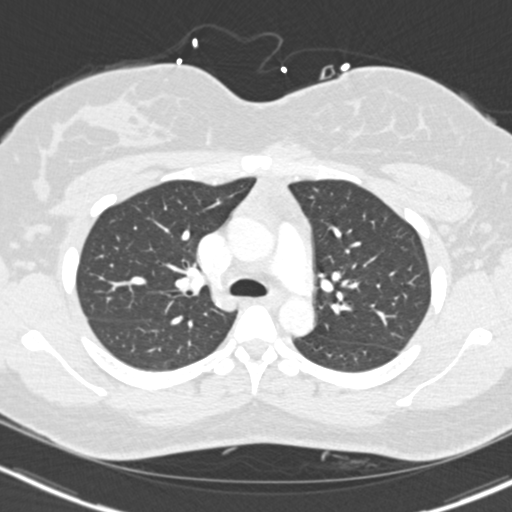
[im 187/234  mediastinal]
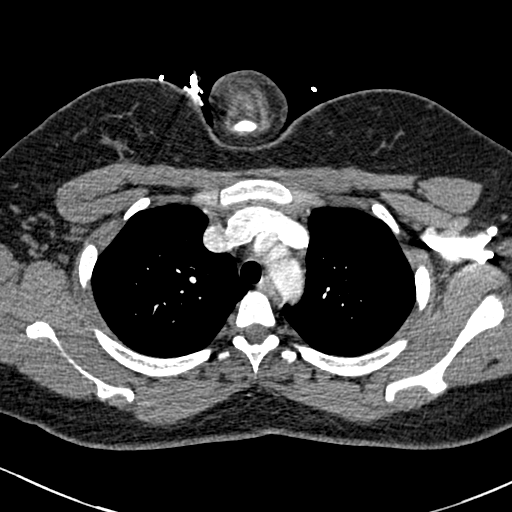
[im 199/234  lung]
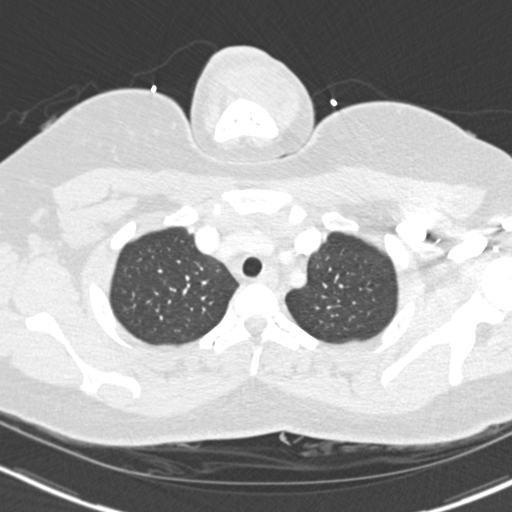
[im 210/234  mediastinal]
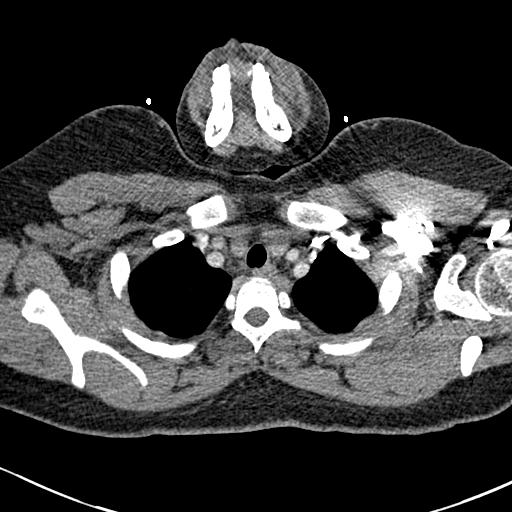
[im 222/234  lung]
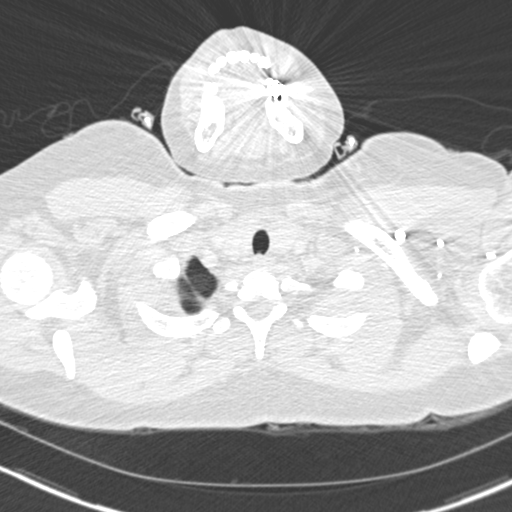

[Series 8: coronal mpr · coronal · 0.46mm/px · 1 of 122 slices shown]
[im 61/122  mediastinal]
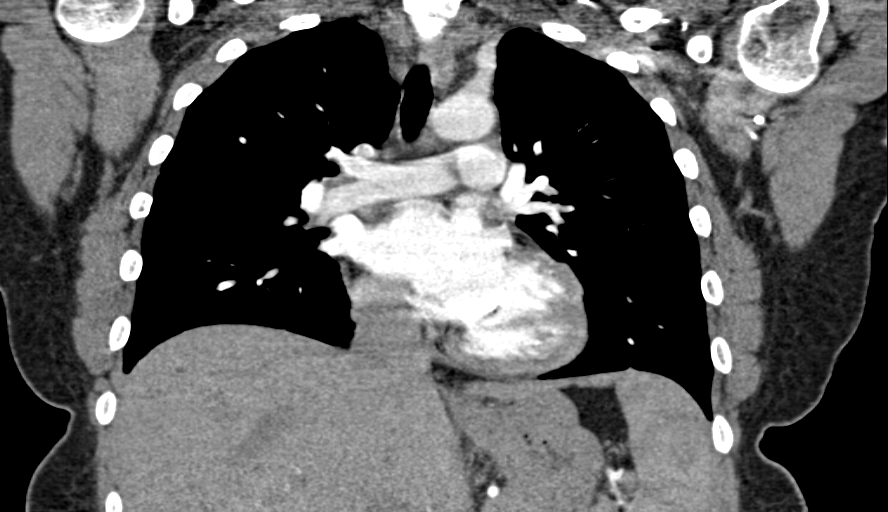

[18 of 36 positions shown; findings below may reference images not displayed]

FINDINGS: The small left-sided pleural effusion is noted. The lungs are well
aerated bilaterally without focal infiltrate or parenchymal nodule.

The thoracic inlet is within normal limits. The thoracic aorta in
its branches are unremarkable. Contrast bolus is somewhat limited
although no pulmonary embolism is identified. No significant hilar
or mediastinal adenopathy is noted. No findings to suggest right
heart strain are seen.

The visualized upper abdomen demonstrates a few hypodensities within
the liver likely representing cysts but too small for adequate
characterization. No acute bony abnormality is noted. Mild
engorgement of the breasts is noted related to the recent pregnant
state.

Review of the MIP images confirms the above findings.
IMPRESSION: No evidence of pulmonary embolism.

Minimal left-sided pleural effusion.

## 2016-07-31 NOTE — L&D Delivery Note (Signed)
Delivery Note At 12:04 PM a viable and healthy female was delivered via Vaginal, Spontaneous Delivery (Presentation: LOA  ).  APGAR: 9, 9; weight  .   Placenta status: spontaneous, intact.  Cord: none with the following complications: na.  Cord pH: na  Anesthesia:  epidural Episiotomy: None Lacerations: 2nd degree;Vaginal Suture Repair: 2.0 vicryl rapide Est. Blood Loss (mL): 200  Mom to postpartum.  Baby to Couplet care / Skin to Skin.  Breonna Gafford J 09/12/2016, 12:50 PM

## 2016-08-24 ENCOUNTER — Encounter (HOSPITAL_COMMUNITY): Payer: Self-pay

## 2016-08-24 ENCOUNTER — Inpatient Hospital Stay (HOSPITAL_COMMUNITY)
Admission: AD | Admit: 2016-08-24 | Discharge: 2016-08-24 | Disposition: A | Discharge status: Home / Self Care | Payer: Managed Care, Other (non HMO) | Source: Ambulatory Visit | Attending: Obstetrics & Gynecology | Admitting: Obstetrics & Gynecology

## 2016-08-24 DIAGNOSIS — Z7982 Long term (current) use of aspirin: Secondary | ICD-10-CM | POA: Insufficient documentation

## 2016-08-24 DIAGNOSIS — Z79899 Other long term (current) drug therapy: Secondary | ICD-10-CM | POA: Diagnosis not present

## 2016-08-24 DIAGNOSIS — N898 Other specified noninflammatory disorders of vagina: Secondary | ICD-10-CM | POA: Insufficient documentation

## 2016-08-24 DIAGNOSIS — R109 Unspecified abdominal pain: Secondary | ICD-10-CM | POA: Insufficient documentation

## 2016-08-24 DIAGNOSIS — Z8249 Family history of ischemic heart disease and other diseases of the circulatory system: Secondary | ICD-10-CM | POA: Diagnosis not present

## 2016-08-24 DIAGNOSIS — O368130 Decreased fetal movements, third trimester, not applicable or unspecified: Secondary | ICD-10-CM | POA: Diagnosis not present

## 2016-08-24 DIAGNOSIS — Z3A36 36 weeks gestation of pregnancy: Secondary | ICD-10-CM | POA: Insufficient documentation

## 2016-08-24 DIAGNOSIS — O479 False labor, unspecified: Secondary | ICD-10-CM

## 2016-08-24 DIAGNOSIS — O26893 Other specified pregnancy related conditions, third trimester: Secondary | ICD-10-CM | POA: Insufficient documentation

## 2016-08-24 NOTE — Discharge Instructions (Signed)

## 2016-08-24 NOTE — MAU Note (Signed)
Pt states that she has had a decrease in fetal movement since 11am. Has felt some kicks, but not as many as she usually does. Pt has hx of tern IUFD. Some irregular braxton hicks contractions. Denies vag bleeding or LOF. Pt is currently feeling movement at this time in triage.

## 2016-08-24 NOTE — MAU Provider Note (Signed)
Chief Complaint:  Decreased Fetal Movement   First Provider Initiated Contact with Patient 08/24/16 2043     HPI: Debra Carter is a 26 y.o. G4P1020 at 4836w4dwho presents to maternity admissions reporting decreased fetal movement since this morning.  Has irregular contractions. Has had a few episodes of wetness in underwear. She denies LOF, vaginal bleeding, vaginal itching/burning, urinary symptoms, h/a, dizziness, n/v, diarrhea, constipation or fever/chills.    Abdominal Pain  This is a new problem. The current episode started today. The onset quality is gradual. The problem occurs intermittently. The problem has been waxing and waning. The pain is located in the suprapubic region. The pain is mild. The quality of the pain is cramping. The abdominal pain does not radiate. Pertinent negatives include no constipation, diarrhea, dysuria, fever, headaches, nausea or vomiting. Associated symptoms comments: Decreased fetal movement. Nothing aggravates the pain. The pain is relieved by nothing. She has tried nothing for the symptoms.   RN Note: Pt states that she has had a decrease in fetal movement since 11am. Has felt some kicks, but not as many as she usually does. Pt has hx of tern IUFD. Some irregular braxton hicks contractions. Denies vag bleeding or LOF. Pt is currently feeling movement at this time in triage.   Past Medical History: Past Medical History:  Diagnosis Date  . Medical history non-contributory     Past obstetric history: OB History  Gravida Para Term Preterm AB Living  4 1 1  0 2 0  SAB TAB Ectopic Multiple Live Births  0 2 0 0      # Outcome Date GA Lbr Len/2nd Weight Sex Delivery Anes PTL Lv  4 Current           3 Term 01/24/15 2773w0d  5 lb 14 oz (2.665 kg) M Vag-Spont EPI  FD  2 TAB           1 TAB               Past Surgical History: Past Surgical History:  Procedure Laterality Date  . INDUCED ABORTION  06/2010    Family History: Family History  Problem  Relation Age of Onset  . Hypertension Mother   . Hypertension Maternal Grandmother     Social History: Social History  Substance Use Topics  . Smoking status: Never Smoker  . Smokeless tobacco: Never Used  . Alcohol use No    Allergies: No Known Allergies  Meds:  Prescriptions Prior to Admission  Medication Sig Dispense Refill Last Dose  . acetaminophen (TYLENOL) 500 MG tablet Take 500 mg by mouth every 6 (six) hours as needed for mild pain or headache.   Past Month at Unknown time  . aspirin EC 81 MG tablet Take 81 mg by mouth daily.   08/23/2016 at Unknown time  . Prenatal Vit-Fe Fumarate-FA (PRENATAL MULTIVITAMIN) TABS tablet Take 1 tablet by mouth daily at 12 noon.   08/24/2016 at Unknown time  . triamcinolone cream (KENALOG) 0.5 % Apply 1 application topically 3 (three) times daily as needed.   Past Month at Unknown time  . Doxylamine-Pyridoxine 10-10 MG TBEC Take 2 tablets by mouth at bedtime as needed. (Patient not taking: Reported on 08/24/2016) 100 tablet 0 Not Taking at Unknown time  . pantoprazole (PROTONIX) 20 MG tablet Take 1 tablet (20 mg total) by mouth daily. (Patient not taking: Reported on 08/24/2016) 30 tablet 1 Not Taking at Unknown time    I have reviewed patient's Past Medical Hx,  Surgical Hx, Family Hx, Social Hx, medications and allergies.   ROS:  Review of Systems  Constitutional: Negative for chills and fever.  Respiratory: Negative for shortness of breath.   Gastrointestinal: Positive for abdominal pain. Negative for constipation, diarrhea, nausea and vomiting.  Genitourinary: Positive for pelvic pain and vaginal discharge. Negative for dysuria and vaginal bleeding.  Neurological: Negative for headaches.   Other systems negative  Physical Exam  Patient Vitals for the past 24 hrs:  BP Temp Temp src Pulse Resp SpO2  08/24/16 2054 115/63 98.4 F (36.9 C) Oral 89 16 100 %   Constitutional: Well-developed, well-nourished female in no acute distress.   Cardiovascular: normal rate and rhythm Respiratory: normal effort, clear to auscultation bilaterally GI: Abd soft, non-tender, gravid appropriate for gestational age.   No rebound or guarding. MS: Extremities nontender, no edema, normal ROM Neurologic: Alert and oriented x 4.  GU: Neg CVAT.  PELVIC EXAM: Cervix pink, visually closed, without lesion, scant white creamy discharge, vaginal walls and external genitalia normal  Dilation: Fingertip Effacement (%): Thick Cervical Position: Posterior Station: Ballotable Exam by:: Wynelle Bourgeois CNM  FHT:  Baseline 140 , moderate variability, accelerations present, no decelerations Contractions:   Irregular     Labs: No results found for this or any previous visit (from the past 24 hour(s)).    Imaging:  No results found.  MAU Course/MDM: NST reviewed   Reactive with audible movement Sterile speculum exam showed no pooling of fluid Consult Dr Juliene Pina with presentation, exam findings and test results.  Treatments in MAU included NST  Assessment: SIUP at [redacted]w[redacted]d Decreased fetal movement Reactive Fetal heart rate tracing, category I Not in labor Vaginal discharge  Plan: Discharge home Labor precautions and fetal kick counts Follow up in Office for prenatal visits and recheck of status Encouraged to return here or to other Urgent Care/ED if she develops worsening of symptoms, increase in pain, fever, or other concerning symptoms.   Pt stable at time of discharge.  Wynelle Bourgeois CNM, MSN Certified Nurse-Midwife 08/24/2016 10:05 PM

## 2016-09-01 ENCOUNTER — Other Ambulatory Visit: Payer: Self-pay | Admitting: Obstetrics and Gynecology

## 2016-09-04 ENCOUNTER — Telehealth (HOSPITAL_COMMUNITY): Payer: Self-pay | Admitting: *Deleted

## 2016-09-04 NOTE — Telephone Encounter (Signed)
Preadmission screen  

## 2016-09-05 ENCOUNTER — Encounter (HOSPITAL_COMMUNITY): Payer: Self-pay | Admitting: *Deleted

## 2016-09-12 ENCOUNTER — Inpatient Hospital Stay (HOSPITAL_COMMUNITY)
Admission: RE | Admit: 2016-09-12 | Discharge: 2016-09-14 | DRG: 775 | Disposition: A | Discharge status: Home / Self Care | Payer: Managed Care, Other (non HMO) | Source: Ambulatory Visit | Attending: Obstetrics and Gynecology | Admitting: Obstetrics and Gynecology

## 2016-09-12 ENCOUNTER — Encounter (HOSPITAL_COMMUNITY): Payer: Self-pay

## 2016-09-12 ENCOUNTER — Inpatient Hospital Stay (HOSPITAL_COMMUNITY): Payer: Managed Care, Other (non HMO) | Admitting: Anesthesiology

## 2016-09-12 DIAGNOSIS — Z6841 Body Mass Index (BMI) 40.0 and over, adult: Secondary | ICD-10-CM | POA: Diagnosis not present

## 2016-09-12 DIAGNOSIS — O99214 Obesity complicating childbirth: Secondary | ICD-10-CM | POA: Diagnosis present

## 2016-09-12 DIAGNOSIS — Z3A39 39 weeks gestation of pregnancy: Secondary | ICD-10-CM

## 2016-09-12 DIAGNOSIS — Z3490 Encounter for supervision of normal pregnancy, unspecified, unspecified trimester: Secondary | ICD-10-CM

## 2016-09-12 DIAGNOSIS — Z3493 Encounter for supervision of normal pregnancy, unspecified, third trimester: Secondary | ICD-10-CM | POA: Diagnosis present

## 2016-09-12 LAB — CBC
HEMATOCRIT: 31.5 % — AB (ref 36.0–46.0)
Hemoglobin: 10.5 g/dL — ABNORMAL LOW (ref 12.0–15.0)
MCH: 26.1 pg (ref 26.0–34.0)
MCHC: 33.3 g/dL (ref 30.0–36.0)
MCV: 78.2 fL (ref 78.0–100.0)
Platelets: 293 10*3/uL (ref 150–400)
RBC: 4.03 MIL/uL (ref 3.87–5.11)
RDW: 15.5 % (ref 11.5–15.5)
WBC: 7.2 10*3/uL (ref 4.0–10.5)

## 2016-09-12 LAB — TYPE AND SCREEN
ABO/RH(D): A POS
Antibody Screen: NEGATIVE

## 2016-09-12 MED ORDER — ONDANSETRON HCL 4 MG/2ML IJ SOLN: 4.0000 mg | INTRAMUSCULAR | Status: DC | PRN

## 2016-09-12 MED ORDER — OXYCODONE-ACETAMINOPHEN 5-325 MG PO TABS: 2.0000 | ORAL_TABLET | ORAL | Status: DC | PRN

## 2016-09-12 MED ORDER — SIMETHICONE 80 MG PO CHEW: 80.0000 mg | CHEWABLE_TABLET | ORAL | Status: DC | PRN

## 2016-09-12 MED ORDER — TERBUTALINE SULFATE 1 MG/ML IJ SOLN
0.2500 mg | Freq: Once | INTRAMUSCULAR | Status: DC | PRN
  Filled 2016-09-12: qty 1

## 2016-09-12 MED ORDER — PENICILLIN G POTASSIUM 5000000 UNITS IJ SOLR
2.5000 10*6.[IU] | INTRAVENOUS | Status: DC
  Filled 2016-09-12 (×7): qty 2.5

## 2016-09-12 MED ORDER — LACTATED RINGERS IV SOLN
500.0000 mL | Freq: Once | INTRAVENOUS | Status: AC
  Administered 2016-09-12: 1000 mL via INTRAVENOUS

## 2016-09-12 MED ORDER — METHYLERGONOVINE MALEATE 0.2 MG/ML IJ SOLN: 0.2000 mg | INTRAMUSCULAR | Status: DC | PRN

## 2016-09-12 MED ORDER — DIBUCAINE 1 % RE OINT: 1.0000 "application " | TOPICAL_OINTMENT | RECTAL | Status: DC | PRN

## 2016-09-12 MED ORDER — PHENYLEPHRINE 40 MCG/ML (10ML) SYRINGE FOR IV PUSH (FOR BLOOD PRESSURE SUPPORT)
80.0000 ug | PREFILLED_SYRINGE | INTRAVENOUS | Status: DC | PRN
  Filled 2016-09-12: qty 5
  Filled 2016-09-12: qty 10

## 2016-09-12 MED ORDER — LIDOCAINE HCL (PF) 1 % IJ SOLN
INTRAMUSCULAR | Status: DC | PRN
  Administered 2016-09-12: 5 mL via EPIDURAL
  Administered 2016-09-12: 7 mL via EPIDURAL

## 2016-09-12 MED ORDER — LACTATED RINGERS IV SOLN
INTRAVENOUS | Status: DC
  Administered 2016-09-12 (×2): via INTRAVENOUS

## 2016-09-12 MED ORDER — TERBUTALINE SULFATE 1 MG/ML IJ SOLN: 0.2500 mg | Freq: Once | INTRAMUSCULAR | Status: DC | PRN

## 2016-09-12 MED ORDER — LACTATED RINGERS IV SOLN
500.0000 mL | INTRAVENOUS | Status: DC | PRN
  Administered 2016-09-12: 500 mL via INTRAVENOUS

## 2016-09-12 MED ORDER — ACETAMINOPHEN 325 MG PO TABS: 650.0000 mg | ORAL_TABLET | ORAL | Status: DC | PRN

## 2016-09-12 MED ORDER — EPHEDRINE 5 MG/ML INJ
10.0000 mg | INTRAVENOUS | Status: DC | PRN
  Filled 2016-09-12: qty 4

## 2016-09-12 MED ORDER — OXYTOCIN 40 UNITS IN LACTATED RINGERS INFUSION - SIMPLE MED
2.5000 [IU]/h | INTRAVENOUS | Status: DC
  Filled 2016-09-12: qty 1000

## 2016-09-12 MED ORDER — METHYLERGONOVINE MALEATE 0.2 MG/ML IJ SOLN: 0.2000 mg | INTRAMUSCULAR | Status: AC | PRN

## 2016-09-12 MED ORDER — SENNOSIDES-DOCUSATE SODIUM 8.6-50 MG PO TABS
2.0000 | ORAL_TABLET | ORAL | Status: DC
  Administered 2016-09-12 – 2016-09-13 (×2): 2 via ORAL
  Filled 2016-09-12 (×2): qty 2

## 2016-09-12 MED ORDER — OXYCODONE-ACETAMINOPHEN 5-325 MG PO TABS
1.0000 | ORAL_TABLET | ORAL | Status: DC | PRN
Start: 2016-09-12 — End: 2016-09-12

## 2016-09-12 MED ORDER — ONDANSETRON HCL 4 MG/2ML IJ SOLN: 4.0000 mg | Freq: Four times a day (QID) | INTRAMUSCULAR | Status: DC | PRN

## 2016-09-12 MED ORDER — ZOLPIDEM TARTRATE 5 MG PO TABS: 5.0000 mg | ORAL_TABLET | Freq: Every evening | ORAL | Status: DC | PRN

## 2016-09-12 MED ORDER — DIPHENHYDRAMINE HCL 25 MG PO CAPS: 25.0000 mg | ORAL_CAPSULE | Freq: Four times a day (QID) | ORAL | Status: DC | PRN

## 2016-09-12 MED ORDER — WITCH HAZEL-GLYCERIN EX PADS: 1.0000 "application " | MEDICATED_PAD | CUTANEOUS | Status: DC | PRN

## 2016-09-12 MED ORDER — IBUPROFEN 600 MG PO TABS
600.0000 mg | ORAL_TABLET | Freq: Four times a day (QID) | ORAL | Status: DC
  Administered 2016-09-12 – 2016-09-14 (×7): 600 mg via ORAL
  Filled 2016-09-12 (×7): qty 1

## 2016-09-12 MED ORDER — FENTANYL CITRATE (PF) 100 MCG/2ML IJ SOLN: 50.0000 ug | INTRAMUSCULAR | Status: DC | PRN

## 2016-09-12 MED ORDER — FLEET ENEMA 7-19 GM/118ML RE ENEM: 1.0000 | ENEMA | RECTAL | Status: DC | PRN

## 2016-09-12 MED ORDER — METHYLERGONOVINE MALEATE 0.2 MG PO TABS
0.2000 mg | ORAL_TABLET | ORAL | Status: AC | PRN
  Administered 2016-09-12 – 2016-09-13 (×6): 0.2 mg via ORAL
  Filled 2016-09-12 (×6): qty 1

## 2016-09-12 MED ORDER — SOD CITRATE-CITRIC ACID 500-334 MG/5ML PO SOLN: 30.0000 mL | ORAL | Status: DC | PRN

## 2016-09-12 MED ORDER — PRENATAL MULTIVITAMIN CH
1.0000 | ORAL_TABLET | Freq: Every day | ORAL | Status: DC
  Administered 2016-09-13: 1 via ORAL
  Filled 2016-09-12: qty 1

## 2016-09-12 MED ORDER — DIPHENHYDRAMINE HCL 50 MG/ML IJ SOLN: 12.5000 mg | INTRAMUSCULAR | Status: DC | PRN

## 2016-09-12 MED ORDER — FENTANYL 2.5 MCG/ML BUPIVACAINE 1/10 % EPIDURAL INFUSION (WH - ANES)
14.0000 mL/h | INTRAMUSCULAR | Status: DC | PRN
  Administered 2016-09-12: 14 mL/h via EPIDURAL
  Filled 2016-09-12: qty 100

## 2016-09-12 MED ORDER — PENICILLIN G POTASSIUM 5000000 UNITS IJ SOLR
5.0000 10*6.[IU] | Freq: Once | INTRAVENOUS | Status: AC
  Administered 2016-09-12: 5 10*6.[IU] via INTRAVENOUS
  Filled 2016-09-12: qty 5

## 2016-09-12 MED ORDER — TETANUS-DIPHTH-ACELL PERTUSSIS 5-2.5-18.5 LF-MCG/0.5 IM SUSP: 0.5000 mL | Freq: Once | INTRAMUSCULAR | Status: DC

## 2016-09-12 MED ORDER — ONDANSETRON HCL 4 MG PO TABS: 4.0000 mg | ORAL_TABLET | ORAL | Status: DC | PRN

## 2016-09-12 MED ORDER — OXYTOCIN 40 UNITS IN LACTATED RINGERS INFUSION - SIMPLE MED
1.0000 m[IU]/min | INTRAVENOUS | Status: DC
  Administered 2016-09-12: 2 m[IU]/min via INTRAVENOUS

## 2016-09-12 MED ORDER — COCONUT OIL OIL: 1.0000 "application " | TOPICAL_OIL | Status: DC | PRN

## 2016-09-12 MED ORDER — OXYTOCIN BOLUS FROM INFUSION
500.0000 mL | Freq: Once | INTRAVENOUS | Status: AC
  Administered 2016-09-12: 500 mL via INTRAVENOUS

## 2016-09-12 MED ORDER — MISOPROSTOL 25 MCG QUARTER TABLET
25.0000 ug | ORAL_TABLET | ORAL | Status: DC | PRN
  Administered 2016-09-12: 25 ug via VAGINAL
  Filled 2016-09-12: qty 1
  Filled 2016-09-12: qty 0.25

## 2016-09-12 MED ORDER — METHYLERGONOVINE MALEATE 0.2 MG PO TABS: 0.2000 mg | ORAL_TABLET | ORAL | Status: DC | PRN

## 2016-09-12 MED ORDER — BENZOCAINE-MENTHOL 20-0.5 % EX AERO
1.0000 "application " | INHALATION_SPRAY | CUTANEOUS | Status: DC | PRN
  Administered 2016-09-12: 1 via TOPICAL
  Filled 2016-09-12: qty 56

## 2016-09-12 MED ORDER — LIDOCAINE HCL (PF) 1 % IJ SOLN
30.0000 mL | INTRAMUSCULAR | Status: DC | PRN
  Filled 2016-09-12: qty 30

## 2016-09-12 MED ORDER — PHENYLEPHRINE 40 MCG/ML (10ML) SYRINGE FOR IV PUSH (FOR BLOOD PRESSURE SUPPORT)
80.0000 ug | PREFILLED_SYRINGE | INTRAVENOUS | Status: DC | PRN
  Filled 2016-09-12: qty 10
  Filled 2016-09-12: qty 5

## 2016-09-12 MED ORDER — OXYCODONE-ACETAMINOPHEN 5-325 MG PO TABS
1.0000 | ORAL_TABLET | ORAL | Status: DC | PRN
  Administered 2016-09-12 – 2016-09-13 (×2): 1 via ORAL
  Filled 2016-09-12 (×2): qty 1

## 2016-09-12 NOTE — Anesthesia Preprocedure Evaluation (Signed)
Anesthesia Evaluation  Patient identified by MRN, date of birth, ID band Patient awake    Reviewed: Allergy & Precautions, H&P , NPO status , Patient's Chart, lab work & pertinent test results  Airway Mallampati: II       Dental no notable dental hx.    Pulmonary neg pulmonary ROS,    Pulmonary exam normal        Cardiovascular Exercise Tolerance: Good Normal cardiovascular exam     Neuro/Psych negative neurological ROS  negative psych ROS   GI/Hepatic negative GI ROS, Neg liver ROS,   Endo/Other  Morbid obesity  Renal/GU negative Renal ROS  negative genitourinary   Musculoskeletal negative musculoskeletal ROS (+)   Abdominal (+) + obese,   Peds negative pediatric ROS (+)  Hematology   Anesthesia Other Findings   Reproductive/Obstetrics (+) Pregnancy TERM IUFD                             Anesthesia Physical  Anesthesia Plan  ASA: III  Anesthesia Plan: Epidural   Post-op Pain Management:    Induction:   Airway Management Planned:   Additional Equipment:   Intra-op Plan:   Post-operative Plan:   Informed Consent: I have reviewed the patients History and Physical, chart, labs and discussed the procedure including the risks, benefits and alternatives for the proposed anesthesia with the patient or authorized representative who has indicated his/her understanding and acceptance.     Plan Discussed with:   Anesthesia Plan Comments:         Anesthesia Quick Evaluation

## 2016-09-12 NOTE — H&P (Signed)
Debra Carter is a 26 y.o. female presenting for IOL for history of FDIU at term in previous pregnancy. OB History    Gravida Para Term Preterm AB Living   4 1 1  0 2 0   SAB TAB Ectopic Multiple Live Births   0 2 0 0       Past Medical History:  Diagnosis Date  . Medical history non-contributory    Past Surgical History:  Procedure Laterality Date  . INDUCED ABORTION  06/2010   Family History: family history includes Cancer in her maternal grandmother; Diabetes in her mother; Hypertension in her maternal grandmother and mother. Social History:  reports that she has never smoked. She has never used smokeless tobacco. She reports that she does not drink alcohol or use drugs.     Maternal Diabetes: No Genetic Screening: Normal Maternal Ultrasounds/Referrals: Normal Fetal Ultrasounds or other Referrals:  None Maternal Substance Abuse:  No Significant Maternal Medications:  None Significant Maternal Lab Results:  None Other Comments:  None  Review of Systems  Constitutional: Negative.   All other systems reviewed and are negative.  Maternal Medical History:  Contractions: Onset was less than 1 hour ago.   Perceived severity is mild.    Fetal activity: Perceived fetal activity is normal.   Last perceived fetal movement was within the past hour.    Prenatal complications: no prenatal complications Prenatal Complications - Diabetes: none.    Dilation: 3 Effacement (%): 50 Station: Ballotable Exam by:: Violeta GelinasG. Whitfield, RN Blood pressure (!) 95/56, pulse 93, temperature (P) 98.2 F (36.8 C), temperature source (P) Oral, resp. rate (P) 18, height 5' (1.524 m), weight 93.9 kg (207 lb), last menstrual period 12/12/2015. Maternal Exam:  Uterine Assessment: Contraction strength is mild.  Contraction frequency is rare.   Abdomen: Patient reports no abdominal tenderness. Fetal presentation: vertex  Introitus: Normal vulva. Normal vagina.  Ferning test: not done.  Nitrazine test:  not done. Amniotic fluid character: not assessed.  Pelvis: adequate for delivery.   Cervix: Cervix evaluated by digital exam.     Physical Exam  Constitutional: She is oriented to person, place, and time. She appears well-developed and well-nourished.  HENT:  Head: Normocephalic and atraumatic.  Neck: Normal range of motion. Neck supple.  Cardiovascular: Normal rate and regular rhythm.   Respiratory: Effort normal and breath sounds normal.  GI: Soft. Bowel sounds are normal.  Genitourinary: Vagina normal and uterus normal.  Musculoskeletal: Normal range of motion.  Neurological: She is alert and oriented to person, place, and time. She has normal reflexes.  Skin: Skin is warm and dry.  Psychiatric: She has a normal mood and affect.    Prenatal labs: ABO, Rh: --/--/A POS (02/13 0044) Antibody: NEG (02/13 0044) Rubella: Immune (09/14 0000) RPR: Nonreactive (09/14 0000)  HBsAg: Negative (09/14 0000)  HIV: Non-reactive (09/14 0000)  GBS: Positive (09/14 0000)   Assessment/Plan: 39 week IUP History of FDIU IOL   Sarahjane Matherly J 09/12/2016, 6:54 AM

## 2016-09-12 NOTE — Anesthesia Postprocedure Evaluation (Signed)
Anesthesia Post Note  Patient: Debra Carter  Procedure(s) Performed: * No procedures listed *  Patient location during evaluation: Mother Baby Anesthesia Type: Epidural Level of consciousness: awake and alert and oriented Pain management: satisfactory to patient Vital Signs Assessment: post-procedure vital signs reviewed and stable Respiratory status: spontaneous breathing and nonlabored ventilation Cardiovascular status: stable Postop Assessment: no headache, no backache, no signs of nausea or vomiting, adequate PO intake and patient able to bend at knees (patient up walking) Anesthetic complications: no        Last Vitals:  Vitals:   09/12/16 1530 09/12/16 1630  BP: 122/70 116/70  Pulse: 70 86  Resp: 20 16  Temp: 37.6 C 37.3 C    Last Pain:  Vitals:   09/12/16 1630  TempSrc: Oral  PainSc: 0-No pain   Pain Goal:                 Tiajah Oyster

## 2016-09-12 NOTE — Progress Notes (Signed)
Patient ID: Debra Carter, female   DOB: 1991-06-27, 26 y.o.   MRN: [id number] Called to review tracingl BP 104/68   Pulse 90   Temp 98.1 F (36.7 C) (Oral)   Resp 16   Ht 5' (1.524 m)   Wt 93.9 kg (207 lb)   LMP 12/12/2015   SpO2 99%   BMI 40.43 kg/m   Category 1 tracing noted. Continue Pitocin

## 2016-09-12 NOTE — Progress Notes (Signed)
Patient ID: Debra Carter, female   DOB: September 09, 1990, 26 y.o.   MRN: [id number]  Notified of slight increase in bleeding prior to transfer per LD- which had subsequently abated BP 122/74 (BP Location: Left Arm)   Pulse 81   Temp 97.4 F (36.3 C) (Oral)   Resp 18   Ht 5' (1.524 m)   Wt 93.9 kg (207 lb)   LMP 12/12/2015   SpO2 100%   Breastfeeding? Unknown   BMI 40.43 kg/m   Minimal bleeding at this time. Will give Methergine series and monitor bleeding.

## 2016-09-12 NOTE — Lactation Note (Signed)
This note was copied from a baby's chart. Lactation Consultation Note  Patient Name: Debra Carter's Date: 09/12/2016 Reason for consult: Initial assessment Breastfeeding consultation services and support information given and reviewed.  This is mom's first baby and newborn is 5 hours old.  Baby has been to the breast twice.  Mom attempting to latch baby when I entered.  Assisted mom with better support under baby using football hold.  After a few attempts baby latched easily and nursed actively.  Basic teaching done and questions answered.  Instructed to feed with any cue.  Encouraged to call with questions/assist prn.  Maternal Data Has patient been taught Hand Expression?: Yes Does the patient have breastfeeding experience prior to this delivery?: No  Feeding Feeding Type: Breast Fed Length of feed: 15 min (per parents)  LATCH Score/Interventions Latch: Grasps breast easily, tongue down, lips flanged, rhythmical sucking. Intervention(s): Adjust position;Assist with latch;Breast massage;Breast compression  Audible Swallowing: A few with stimulation Intervention(s): Skin to skin;Hand expression;Alternate breast massage  Type of Nipple: Everted at rest and after stimulation  Comfort (Breast/Nipple): Soft / non-tender     Hold (Positioning): Assistance needed to correctly position infant at breast and maintain latch. Intervention(s): Breastfeeding basics reviewed;Support Pillows;Position options;Skin to skin  LATCH Score: 8  Lactation Tools Discussed/Used     Consult Status Consult Status: Follow-up Date: 09/13/16 Follow-up type: In-patient    Huston FoleyMOULDEN, Keean Wilmeth S 09/12/2016, 5:47 PM

## 2016-09-12 NOTE — Anesthesia Pain Management Evaluation Note (Signed)
  CRNA Pain Management Visit Note  Patient: Debra Carter, 26 y.o., female  "Hello I am a [facility] the [facility]. We have an anesthesia team available at all times to provide care throughout the hospital, including epidural management and anesthesia for C-section. I don't know your plan for the delivery whether it a natural birth, water birth, IV sedation, nitrous supplementation, doula or epidural, but we want to meet your pain goals."   1.Was your pain managed to your expectations on prior hospitalizations?   No prior hospitalizations  2.What is your expectation for pain management during this hospitalization?     IV pain meds  3.How can we help you reach that goal? IV pain meds.  Record the patient's initial score and the patient's pain goal.   Pain: 7  Pain Goal: 8 [facility] wants you to be able to say your pain was always managed very well.  Concetta Guion L 09/12/2016

## 2016-09-12 NOTE — Anesthesia Procedure Notes (Signed)
Epidural Patient location during procedure: OB Start time: 09/12/2016 8:05 AM End time: 09/12/2016 8:08 AM  Staffing Anesthesiologist: Leilani AbleHATCHETT, Christna Kulick Performed: anesthesiologist   Preanesthetic Checklist Completed: patient identified, site marked, surgical consent, pre-op evaluation, timeout performed, IV checked, risks and benefits discussed and monitors and equipment checked  Epidural Patient position: sitting Prep: site prepped and draped and DuraPrep Patient monitoring: continuous pulse ox and blood pressure Approach: midline Location: L3-L4 Injection technique: LOR air  Needle:  Needle type: Tuohy  Needle gauge: 17 G Needle length: 9 cm and 9 Needle insertion depth: 6 cm Catheter type: closed end flexible Catheter size: 19 Gauge Catheter at skin depth: 11 cm Test dose: negative and Other  Assessment Sensory level: T9 Events: blood not aspirated, injection not painful, no injection resistance, negative IV test and no paresthesia

## 2016-09-13 ENCOUNTER — Encounter (HOSPITAL_COMMUNITY): Payer: Self-pay

## 2016-09-13 LAB — CBC
HEMATOCRIT: 30.7 % — AB (ref 36.0–46.0)
Hemoglobin: 10.3 g/dL — ABNORMAL LOW (ref 12.0–15.0)
MCH: 26.3 pg (ref 26.0–34.0)
MCHC: 33.6 g/dL (ref 30.0–36.0)
MCV: 78.3 fL (ref 78.0–100.0)
PLATELETS: 219 10*3/uL (ref 150–400)
RBC: 3.92 MIL/uL (ref 3.87–5.11)
RDW: 15.6 % — AB (ref 11.5–15.5)
WBC: 10.7 10*3/uL — AB (ref 4.0–10.5)

## 2016-09-13 LAB — RPR: RPR Ser Ql: NONREACTIVE

## 2016-09-13 NOTE — Plan of Care (Signed)
Problem: Pain Management: Goal: General experience of comfort will improve and pain level will decrease Patient complaining of lower back pain at epidural insertion site. Medications, heat packs, Kpad, shower and ambulation for pain management in process.

## 2016-09-13 NOTE — Progress Notes (Signed)
Patient ID: Debra Carter, female   DOB: 19-Jun-1991, 26 y.o.   MRN: [id number] PPD # 1 26 y.o. SVD with 2nd Degree Perineal Laceration  S:  Reports feeling well.             Tolerating po/ No nausea or vomiting             Bleeding is light             Pain controlled with ibuprofen (OTC)             Up ad lib / ambulatory / voiding without difficulties     Information for the patient's newborn:  Debra Carter, Boy Debra Carter [[id number]][[id number]]  female "Debra Carter"   breast feeding  / Circumcision planning   O:  A & O x 3, in no apparent distress              VS:  Vitals:   09/12/16 1630 09/12/16 1900 09/12/16 2228 09/13/16 0534  BP: 116/70 (!) 111/58 115/71 108/71  Pulse: 86 69 82 75  Resp: 16 18 18 18   Temp: 99.1 F (37.3 C) 97.3 F (36.3 C) 98.3 F (36.8 C) 98.6 F (37 C)  TempSrc: Oral Oral Oral   SpO2: 100%     Weight:      Height:        LABS:  Recent Labs  09/12/16 0044 09/13/16 0534  WBC 7.2 10.7*  HGB 10.5* 10.3*  HCT 31.5* 30.7*  PLT 293 219    Blood type: --/--/A POS (02/13 0044)  Rubella: Immune (09/14 0000)   I&O: I/O last 3 completed shifts: In: -  Out: 1964 [Urine:650; Blood:1314]          No intake/output data recorded.    Abdomen: soft, non-tender, non-distended             Fundus: firm, non-tender, U-1  Perineum: 2nd degree repair healing well, no edema  Lochia: minimal  Extremities: No edema, no calf pain or tenderness    A/P: PPD # 1 26 y.o., [id number]   Principal Problem:   Postpartum care following vaginal delivery (2/13) Active Problems:   Pregnancy   Spontaneous vaginal delivery   Second degree perineal laceration during delivery    Doing well - stable status  Routine post partum orders  Anticipate discharge tomorrow    Debra Carter, Debra Carter, M, MSN, CNM 09/13/2016, 8:40 AM

## 2016-09-13 NOTE — Lactation Note (Signed)
This note was copied from a baby's chart. Lactation Consultation Note  Patient Name: Debra Carter ZOXWR'UToday's Date: 09/13/2016 Reason for consult: Follow-up assessment Baby at 30 hr of life. RN reported that baby's bili was rising. Upon entry Parents were trying to set up the Medela PIS. Offered the use of the Symphony and mom stated that she wants to use her pump. Dad and visitor asked that the Symphony be set up too. Mom stated she "might try it to see if it works better". While setting up the pump a visitor was holding baby and repeatedly put a pacifier in his mouth when he was showing hunger cues. Mom was agreeable to latching baby. Discussed baby behavior, feeding frequency, feeding cues, pacifier use, possibly supplementing, baby belly size, voids, wt loss, breast changes, and nipple care. Mom is aware of lactation services and support group.    Maternal Data    Feeding Feeding Type: Breast Fed Length of feed: 25 min  LATCH Score/Interventions Latch: Grasps breast easily, tongue down, lips flanged, rhythmical sucking. Intervention(s): Breast compression  Audible Swallowing: None Intervention(s): Hand expression;Skin to skin Intervention(s): Alternate breast massage  Type of Nipple: Everted at rest and after stimulation  Comfort (Breast/Nipple): Soft / non-tender     Hold (Positioning): Assistance needed to correctly position infant at breast and maintain latch. Intervention(s): Position options;Support Pillows  LATCH Score: 7  Lactation Tools Discussed/Used Pump Review: Setup, frequency, and cleaning;Milk Storage;Other (comment) (pump settings) Initiated by:: ES Date initiated:: 09/06/16   Consult Status Consult Status: Follow-up Date: 09/14/16 Follow-up type: In-patient    Rulon Eisenmengerlizabeth E Sedrick Tober 09/13/2016, 6:51 PM

## 2016-09-14 MED ORDER — COCONUT OIL OIL: 1.0000 "application " | TOPICAL_OIL | 0 refills | Status: DC | PRN

## 2016-09-14 MED ORDER — BENZOCAINE-MENTHOL 20-0.5 % EX AERO: 1.0000 "application " | INHALATION_SPRAY | CUTANEOUS | Status: DC | PRN

## 2016-09-14 MED ORDER — IBUPROFEN 600 MG PO TABS: 600.0000 mg | ORAL_TABLET | Freq: Four times a day (QID) | ORAL | 0 refills | Status: DC

## 2016-09-14 NOTE — Progress Notes (Signed)
Post Partum Day #2           Information for the patient's newborn:  Donnalee CurryCheek, Boy Skylen [[id number]][[id number]]  female  circumcision complete Baby name: [name] Feeding: breast  Subjective: No HA, SOB, CP, F/C, breast symptoms. Pain minimal. Normal vaginal bleeding, no clots.      Objective:  VS:  Vitals:   09/12/16 2228 09/13/16 0534 09/13/16 1802 09/14/16 0618  BP: 115/71 108/71 119/66 105/67  Pulse: 82 75 71 75  Resp: 18 18 18 18   Temp: 98.3 F (36.8 C) 98.6 F (37 C) 98.6 F (37 C) 97.8 F (36.6 C)  TempSrc: Oral  Oral Oral  SpO2:      Weight:      Height:        No intake or output data in the 24 hours ending 09/14/16 0842     Recent Labs  09/12/16 0044 09/13/16 0534  WBC 7.2 10.7*  HGB 10.5* 10.3*  HCT 31.5* 30.7*  PLT 293 219    Blood type: --/--/A POS (02/13 0044) Rubella: Immune (09/14 0000)    Physical Exam:  General: alert, cooperative and no distress Uterine Fundus: firm Lochia: appropriate Perineum: repair intact, edema mild DVT Evaluation: No cords or calf tenderness. No significant calf/ankle edema.    Assessment/Plan: PPD # 2 / 26 y.o., [id number] S/P:induced vaginal   Principal Problem:   Postpartum care following vaginal delivery (2/13) Active Problems:   Spontaneous vaginal delivery   Second degree perineal laceration during delivery    normal postpartum exam  Continue current postpartum care             DC home today w/ instructions  F[facility] in 6 weeks and PRN   LOS: 2 days   Neta Mendsaniela C Paul, CNM, MSN 09/14/2016, 8:42 AM

## 2016-09-14 NOTE — Lactation Note (Signed)
This note was copied from a baby's chart. Lactation Consultation Note; Mom has been supplementing per MD order. Has been latching baby and reports he is latching well. Is supplementing by bottle after nursing. Baby asleep on mom's chest- had 35 ml of formula after last feeding.  Mom reports breasts do not feel any heavier yet. Encouragement given.   Reports she pumped 2 times yesterday but did not get anything. Encouraged to pump after every nursing to promote milk supply. Has Medela pump for home.  Reviewed our phone number, OP appointments and BFS[facility] as resources for support after DC. No questions at present. To call prn  Patient Name: Boy Tommie Ardoni Tirone ZOXWR'UToday's Date: 09/14/2016 Reason for consult: Follow-up assessment   Maternal Data Formula Feeding for Exclusion: No Has patient been taught Hand Expression?: Yes Does the patient have breastfeeding experience prior to this delivery?: No  Feeding    LATCH Score/Interventions                      Lactation Tools Discussed/Used     Consult Status Consult Status: Complete    Pamelia HoitWeeks, Yonathan Perrow D 09/14/2016, 9:05 AM

## 2016-09-14 NOTE — Discharge Summary (Signed)
Obstetric Discharge Summary Reason for Admission: induction of labor and hystory of intrauterine fetal demise Prenatal Procedures: NST and ultrasound Intrapartum Procedures: spontaneous vaginal delivery, GBS prophylaxis and epidural Postpartum Procedures: none Complications-Operative and Postpartum: 2nd degree perineal laceration Hemoglobin  Date Value Ref Range Status  09/13/2016 10.3 (L) 12.0 - 15.0 g/dL Final   HCT  Date Value Ref Range Status  09/13/2016 30.7 (L) 36.0 - 46.0 % Final    Physical Exam:  General: alert, cooperative and no distress Lochia: appropriate Uterine Fundus: firm Incision: healing well DVT Evaluation: No cords or calf tenderness. No significant calf/ankle edema.  Discharge Diagnoses: Term Pregnancy-delivered  Discharge Information: Date: 09/14/2016 Activity: pelvic rest Diet: routine Medications: PNV and Ibuprofen Condition: stable Instructions: refer to practice specific booklet Discharge to: home Follow-up Information    Debra Carter,Debra Carter, Debra Carter. Go in 6 week(s).   Specialty:  Obstetrics and Gynecology Contact information: Debra Carter           Newborn Data: Live born female Debra Carter Birth Weight: 8 lb 5.3 oz (3780 g) APGAR: 9, 9  Home with mother.  Debra Carter, CNM 09/14/2016, 9:01 AM

## 2018-02-04 DIAGNOSIS — F4323 Adjustment disorder with mixed anxiety and depressed mood: Secondary | ICD-10-CM | POA: Diagnosis not present

## 2018-02-14 DIAGNOSIS — F339 Major depressive disorder, recurrent, unspecified: Secondary | ICD-10-CM | POA: Diagnosis not present

## 2018-03-19 DIAGNOSIS — F339 Major depressive disorder, recurrent, unspecified: Secondary | ICD-10-CM | POA: Diagnosis not present

## 2018-04-12 DIAGNOSIS — F339 Major depressive disorder, recurrent, unspecified: Secondary | ICD-10-CM | POA: Diagnosis not present

## 2018-04-25 DIAGNOSIS — F339 Major depressive disorder, recurrent, unspecified: Secondary | ICD-10-CM | POA: Diagnosis not present

## 2018-04-30 DIAGNOSIS — H52223 Regular astigmatism, bilateral: Secondary | ICD-10-CM | POA: Diagnosis not present

## 2018-05-01 DIAGNOSIS — Z6829 Body mass index (BMI) 29.0-29.9, adult: Secondary | ICD-10-CM | POA: Diagnosis not present

## 2018-05-01 DIAGNOSIS — Z01419 Encounter for gynecological examination (general) (routine) without abnormal findings: Secondary | ICD-10-CM | POA: Diagnosis not present

## 2018-05-01 DIAGNOSIS — F339 Major depressive disorder, recurrent, unspecified: Secondary | ICD-10-CM | POA: Diagnosis not present

## 2018-05-08 ENCOUNTER — Encounter: Payer: Self-pay | Admitting: Emergency Medicine

## 2018-05-08 ENCOUNTER — Emergency Department (HOSPITAL_COMMUNITY)
Admission: EM | Admit: 2018-05-08 | Discharge: 2018-05-08 | Disposition: A | Discharge status: Home / Self Care | Payer: BLUE CROSS/BLUE SHIELD | Attending: Emergency Medicine | Admitting: Emergency Medicine

## 2018-05-08 DIAGNOSIS — Y92414 Local residential or business street as the place of occurrence of the external cause: Secondary | ICD-10-CM | POA: Diagnosis not present

## 2018-05-08 DIAGNOSIS — Y999 Unspecified external cause status: Secondary | ICD-10-CM | POA: Insufficient documentation

## 2018-05-08 DIAGNOSIS — M549 Dorsalgia, unspecified: Secondary | ICD-10-CM | POA: Diagnosis present

## 2018-05-08 DIAGNOSIS — M542 Cervicalgia: Secondary | ICD-10-CM | POA: Insufficient documentation

## 2018-05-08 DIAGNOSIS — Y9389 Activity, other specified: Secondary | ICD-10-CM | POA: Insufficient documentation

## 2018-05-08 MED ORDER — METHOCARBAMOL 500 MG PO TABS: 500.0000 mg | ORAL_TABLET | Freq: Two times a day (BID) | ORAL | 0 refills | Status: DC

## 2018-05-08 MED ORDER — LIDOCAINE 5 % EX PTCH: 1.0000 | MEDICATED_PATCH | CUTANEOUS | 0 refills | Status: DC

## 2018-05-08 NOTE — Discharge Instructions (Addendum)
Expect your soreness to increase over the next 2-3 days. Take it easy, but do not lay around too much as this may make any stiffness worse.  °Antiinflammatory medications: Take 600 mg of ibuprofen every 6 hours or 440 mg (over the counter dose) to 500 mg (prescription dose) of naproxen every 12 hours for the next 3 days. After this time, these medications may be used as needed for pain. Take these medications with food to avoid upset stomach. Choose only one of these medications, do not take them together. °Acetaminophen (generic for Tylenol): Should you continue to have additional pain while taking the ibuprofen or naproxen, you may add in acetaminophen as needed. Your daily total maximum amount of acetaminophen from all sources should be limited to 4000mg/day for persons without liver problems, or 2000mg/day for those with liver problems. °Muscle relaxer: Robaxin is a muscle relaxer and may help loosen stiff muscles. Do not take the Robaxin while driving or performing other dangerous activities.  °Lidocaine patches: These are available via either prescription or over-the-counter. The over-the-counter option may be more economical one and are likely just as effective. There are multiple over-the-counter brands, such as Salonpas. °Exercises: Be sure to perform the attached exercises starting with three times a week and working up to performing them daily. This is an essential part of preventing long term problems.  °Follow up: Follow up with a primary care provider for any future management of these complaints. Be sure to follow up within 7-10 days. °Return: Return to the ED should symptoms worsen. ° °For prescription assistance, may try using prescription discount sites or apps, such as goodrx.com °
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
## 2018-05-12 DIAGNOSIS — F339 Major depressive disorder, recurrent, unspecified: Secondary | ICD-10-CM | POA: Diagnosis not present

## 2018-05-13 DIAGNOSIS — L304 Erythema intertrigo: Secondary | ICD-10-CM | POA: Diagnosis not present

## 2018-05-13 DIAGNOSIS — B009 Herpesviral infection, unspecified: Secondary | ICD-10-CM | POA: Diagnosis not present

## 2018-05-29 DIAGNOSIS — F339 Major depressive disorder, recurrent, unspecified: Secondary | ICD-10-CM | POA: Diagnosis not present

## 2020-01-20 DIAGNOSIS — F4325 Adjustment disorder with mixed disturbance of emotions and conduct: Secondary | ICD-10-CM | POA: Diagnosis not present

## 2020-03-12 DIAGNOSIS — F4325 Adjustment disorder with mixed disturbance of emotions and conduct: Secondary | ICD-10-CM | POA: Diagnosis not present

## 2021-10-24 ENCOUNTER — Ambulatory Visit
Admission: EM | Admit: 2021-10-24 | Discharge: 2021-10-24 | Disposition: A | Discharge status: Home / Self Care | Payer: BC Managed Care – PPO | Attending: Emergency Medicine | Admitting: Emergency Medicine

## 2021-10-24 ENCOUNTER — Other Ambulatory Visit: Payer: Self-pay

## 2021-10-24 DIAGNOSIS — J329 Chronic sinusitis, unspecified: Secondary | ICD-10-CM

## 2021-10-24 DIAGNOSIS — L500 Allergic urticaria: Secondary | ICD-10-CM

## 2021-10-24 DIAGNOSIS — H6993 Unspecified Eustachian tube disorder, bilateral: Secondary | ICD-10-CM

## 2021-10-24 DIAGNOSIS — J310 Chronic rhinitis: Secondary | ICD-10-CM | POA: Diagnosis not present

## 2021-10-24 DIAGNOSIS — H6983 Other specified disorders of Eustachian tube, bilateral: Secondary | ICD-10-CM | POA: Diagnosis not present

## 2021-10-24 DIAGNOSIS — J302 Other seasonal allergic rhinitis: Secondary | ICD-10-CM

## 2021-10-24 MED ORDER — MONTELUKAST SODIUM 10 MG PO TABS: 10.0000 mg | ORAL_TABLET | Freq: Every day | ORAL | 1 refills | Status: AC

## 2021-10-24 MED ORDER — FEXOFENADINE HCL 180 MG PO TABS: 180.0000 mg | ORAL_TABLET | Freq: Every day | ORAL | 1 refills | Status: AC

## 2021-10-24 MED ORDER — METHYLPREDNISOLONE 4 MG PO TBPK: ORAL_TABLET | ORAL | 0 refills | Status: AC

## 2021-10-24 MED ORDER — METHYLPREDNISOLONE SODIUM SUCC 125 MG IJ SOLR
125.0000 mg | Freq: Once | INTRAMUSCULAR | Status: AC
  Administered 2021-10-24: 125 mg via INTRAMUSCULAR

## 2021-10-24 NOTE — Discharge Instructions (Addendum)
Your symptoms and my physical exam findings are concerning for exacerbation of your underlying allergies.  It is important that you are consistent with taking allergy medications exactly as prescribed.   ?  ?Please see the list below for recommended medications, dosages and frequencies to provide relief of your current symptoms:   ?  ?Methylprednisolone IM (Solu-Medrol):  To quickly address your significant respiratory inflammation, you were provided with an injection of methylprednisolone in the office today.  You should continue to feel the full benefit of the steroid for the next 4 to 6 hours.  ?  ?Methylprednisolone (Medrol Dosepak): This is a steroid that will significantly calm your upper and lower airways, please take one row of tablets daily with your breakfast meal starting tomorrow morning until the prescription is complete.    ? ?Allegra (fexofenadine): This is an excellent second-generation antihistamine that helps to reduce respiratory inflammatory response to environmental allergens.  This medication is not known to cause daytime sleepiness so it can be taken in the daytime.  If you find that it does make you sleepy, please feel free to take it at bedtime. ?  ?Singulair (montelukast): This is a mast cell stabilizer that works well with antihistamines.  Mast cells are responsible for stimulating histamine production so you can imagine that if we can reduce the activity of your mast cells, then fewer histamines will be produced and inflammation caused by allergy exposure will be significantly reduced.  Recommend he take this medication at the same time you take your antihistamine. ?  ?If you find that you have not had significant relief of your symptoms in the next 3 to 5 days, please follow-up with your primary care provider or return to urgent care for repeat evaluation. ?  ?Thank you for visiting urgent care today.  We appreciate the opportunity to participate in your care. ? ?

## 2021-10-24 NOTE — ED Triage Notes (Addendum)
Pt states this morning around 4 she began itching over her body, having swelling and redness to her face. Pt denies SOB at this time. ?

## 2021-10-24 NOTE — ED Provider Notes (Signed)
?[facility] ? ? ? ?CSN: [id number] ?Arrival date & time: 10/24/21  [id number] ?  ? ?HISTORY  ? ?Chief Complaint  ?Patient presents with  ? Allergic Reaction  ? ?HPI ?Debra Carter is a 31 y.o. female. Pt states this morning around 4 am she began itching al over her body but did not think much about it until she woke up and noticed that she also had swelling and redness to her face which has persisted.  Patient reports a history of seasonal allergies, not currently taking allergy medication.  Patient states she is never had a rash like this before.  Patient states she taken out of town last weekend to the beach thinking she could get away from the pollen but believes that this may have made it worse.  Patient states has not tried anything for her symptoms prior to arrival.  Patient endorses runny nose, denies sore throat, cough, congestion, ear fullness, loss of hearing, ear pain. ? ?The history is provided by the patient.  ?Past Medical History:  ?Diagnosis Date  ? Medical history non-contributory   ? Postpartum care following vaginal delivery (2/13) 09/13/2016  ? Second degree perineal laceration during delivery 09/13/2016  ? Spontaneous vaginal delivery 09/13/2016  ? ?Patient Active Problem List  ? Diagnosis Date Noted  ? Spontaneous vaginal delivery 09/13/2016  ? Second degree perineal laceration during delivery 09/13/2016  ? Postpartum care following vaginal delivery (2/13) 09/13/2016  ? Menorrhagia with irregular cycle 10/09/2013  ? ?Past Surgical History:  ?Procedure Laterality Date  ? INDUCED ABORTION  06/2010  ? ?OB History   ? ? Gravida  ?4  ? Para  ?2  ? Term  ?2  ? Preterm  ?0  ? AB  ?2  ? Living  ?1  ?  ? ? SAB  ?0  ? IAB  ?2  ? Ectopic  ?0  ? Multiple  ?0  ? Live Births  ?1  ?   ?  ?  ? ?Home Medications   ? ?Prior to Admission medications   ?Not on File  ? ?Family History ?Family History  ?Problem Relation Age of Onset  ? Hypertension Mother   ? Diabetes Mother   ? Hypertension Maternal Grandmother    ? Cancer Maternal Grandmother   ?     colon  ? ?Social History ?Social History  ? ?Tobacco Use  ? Smoking status: Never  ? Smokeless tobacco: Never  ?Substance Use Topics  ? Alcohol use: Yes  ? Drug use: Yes  ?  Types: Marijuana  ?  Comment: occasionally  ? ?Allergies   ?Patient has no known allergies. ? ?Review of Systems ?Review of Systems ?Pertinent findings noted in history of present illness.  ? ?Physical Exam ?Triage Vital Signs ?ED Triage Vitals  ?Enc Vitals Group  ?   BP 05/27/21 0827 (!) 147/82  ?   Pulse Rate 05/27/21 0827 72  ?   Resp 05/27/21 0827 18  ?   Temp 05/27/21 0827 98.3 ?F (36.8 ?C)  ?   Temp Source 05/27/21 0827 Oral  ?   SpO2 05/27/21 0827 98 %  ?   Weight --   ?   Height --   ?   Head Circumference --   ?   Peak Flow --   ?   Pain Score 05/27/21 0826 5  ?   Pain Loc --   ?   Pain Edu? --   ?   Excl. in GC? --   ?  No data found. ? ?Updated Vital Signs ?BP 124/84 (BP Location: Right Arm)   Pulse 84   Temp 98.6 ?F (37 ?C) (Oral)   Resp 18   LMP 09/13/2021 (Approximate)   SpO2 97%  ? ?Physical Exam ?Vitals and nursing note reviewed.  ?Constitutional:   ?   General: She is not in acute distress. ?   Appearance: Normal appearance. She is not ill-appearing.  ?HENT:  ?   Head: Normocephalic and atraumatic.  ?   Salivary Glands: Right salivary gland is not diffusely enlarged or tender. Left salivary gland is not diffusely enlarged or tender.  ?   Right Ear: Ear canal and external ear normal. No drainage. A middle ear effusion is present. There is no impacted cerumen. Tympanic membrane is bulging. Tympanic membrane is not injected or erythematous.  ?   Left Ear: Ear canal and external ear normal. No drainage. A middle ear effusion is present. There is no impacted cerumen. Tympanic membrane is bulging. Tympanic membrane is not injected or erythematous.  ?   Ears:  ?   Comments: Bilateral EACs normal, both TMs bulging with clear fluid ?   Nose: Rhinorrhea present. No nasal deformity, septal  deviation, signs of injury, nasal tenderness, mucosal edema or congestion. Rhinorrhea is clear.  ?   Right Nostril: Occlusion present. No foreign body, epistaxis or septal hematoma.  ?   Left Nostril: Occlusion present. No foreign body, epistaxis or septal hematoma.  ?   Right Turbinates: Enlarged, swollen and pale.  ?   Left Turbinates: Enlarged, swollen and pale.  ?   Right Sinus: No maxillary sinus tenderness or frontal sinus tenderness.  ?   Left Sinus: No maxillary sinus tenderness or frontal sinus tenderness.  ?   Mouth/Throat:  ?   Lips: Pink. No lesions.  ?   Mouth: Mucous membranes are moist. No oral lesions.  ?   Pharynx: Oropharynx is clear. Uvula midline. No posterior oropharyngeal erythema or uvula swelling.  ?   Tonsils: No tonsillar exudate. 0 on the right. 0 on the left.  ?   Comments: Postnasal drip ?Eyes:  ?   General: Lids are normal.     ?   Right eye: No discharge.     ?   Left eye: No discharge.  ?   Extraocular Movements: Extraocular movements intact.  ?   Conjunctiva/sclera: Conjunctivae normal.  ?   Right eye: Right conjunctiva is not injected.  ?   Left eye: Left conjunctiva is not injected.  ?Neck:  ?   Trachea: Trachea and phonation normal.  ?Cardiovascular:  ?   Rate and Rhythm: Normal rate and regular rhythm.  ?   Pulses: Normal pulses.  ?   Heart sounds: Normal heart sounds. No murmur heard. ?  No friction rub. No gallop.  ?Pulmonary:  ?   Effort: Pulmonary effort is normal. No accessory muscle usage, prolonged expiration or respiratory distress.  ?   Breath sounds: Normal breath sounds. No stridor, decreased air movement or transmitted upper airway sounds. No decreased breath sounds, wheezing, rhonchi or rales.  ?Chest:  ?   Chest wall: No tenderness.  ?Musculoskeletal:     ?   General: Normal range of motion.  ?   Cervical back: Normal range of motion and neck supple. Normal range of motion.  ?Lymphadenopathy:  ?   Cervical: No cervical adenopathy.  ?Skin: ?   General: Skin is warm  and dry.  ?   Findings: Rash (Erythema,  mild edema of face and bilateral upper arms with urticarial type rash) present. No erythema.  ?Neurological:  ?   General: No focal deficit present.  ?   Mental Status: She is alert and oriented to person, place, and time.  ?Psychiatric:     ?   Mood and Affect: Mood normal.     ?   Behavior: Behavior normal.  ? ? ?Visual Acuity ?Right Eye Distance:   ?Left Eye Distance:   ?Bilateral Distance:   ? ?Right Eye Near:   ?Left Eye Near:    ?Bilateral Near:    ? ?UC Couse / Diagnostics / Procedures:  ?  ?EKG ? ?Radiology ?No results found. ? ?Procedures ?Procedures (including critical care time) ? ?UC Diagnoses / Final Clinical Impressions(s)   ?I have reviewed the triage vital signs and the nursing notes. ? ?Pertinent labs & imaging results that were available during my care of the patient were reviewed by me and considered in my medical decision making (see chart for details).   ?Final diagnoses:  ?Seasonal allergies  ?Allergic urticaria  ?Rhinosinusitis  ?Eustachian tube dysfunction, bilateral  ? ?Patient provided with an injection of methylprednisolone as well as a prescription for oral methylprednisolone in case her symptoms should return within the next 24 to 36 hours.  Patient also advised to begin and continue antihistamine and nasal stabilizer through June.  Return precautions advised. ? ?ED Prescriptions   ? ? Medication Sig Dispense Auth. Provider  ? fexofenadine (ALLEGRA) 180 MG tablet Take 1 tablet (180 mg total) by mouth daily. 90 tablet Theadora Rama Scales, PA-C  ? montelukast (SINGULAIR) 10 MG tablet Take 1 tablet (10 mg total) by mouth at bedtime. 90 tablet Theadora Rama Scales, PA-C  ? methylPREDNISolone (MEDROL DOSEPAK) 4 MG TBPK tablet Take 24 mg on day 1, 20 mg on day 2, 16 mg on day 3, 12 mg on day 4, 8 mg on day 5, 4 mg on day 6. 21 tablet Theadora Rama Scales, PA-C  ? ?  ? ?PDMP not reviewed this encounter. ? ?Pending results:  ?Labs Reviewed - No data  to display ? ?Medications Ordered in UC: ?Medications  ?methylPREDNISolone sodium succinate (SOLU-MEDROL) 125 mg/2 mL injection 125 mg (has no administration in time range)  ? ? ?Disposition Upon Discharge:  ?Cond
